# Patient Record
Sex: Male | Born: 1960 | Race: White | Hispanic: No | Marital: Married | Smoking: Former smoker
Health system: Southern US, Community
[De-identification: ages and names within clinical notes are randomized; demographics above are authoritative.]

## PROBLEM LIST (undated history)

## (undated) DIAGNOSIS — E785 Hyperlipidemia, unspecified: Secondary | ICD-10-CM

## (undated) DIAGNOSIS — K219 Gastro-esophageal reflux disease without esophagitis: Secondary | ICD-10-CM

## (undated) DIAGNOSIS — I10 Essential (primary) hypertension: Secondary | ICD-10-CM

## (undated) DIAGNOSIS — J40 Bronchitis, not specified as acute or chronic: Secondary | ICD-10-CM

## (undated) DIAGNOSIS — F1020 Alcohol dependence, uncomplicated: Secondary | ICD-10-CM

## (undated) DIAGNOSIS — J449 Chronic obstructive pulmonary disease, unspecified: Secondary | ICD-10-CM

## (undated) DIAGNOSIS — G4730 Sleep apnea, unspecified: Secondary | ICD-10-CM

## (undated) DIAGNOSIS — M1990 Unspecified osteoarthritis, unspecified site: Secondary | ICD-10-CM

## (undated) HISTORY — PX: COLONOSCOPY: SHX174

## (undated) HISTORY — PX: TOOTH EXTRACTION: SUR596

---

## 1998-05-26 HISTORY — PX: MULTIPLE TOOTH EXTRACTIONS: SHX2053

## 1999-04-03 ENCOUNTER — Encounter: Admission: RE | Admit: 1999-04-03 | Discharge: 1999-04-03 | Payer: Self-pay | Admitting: Family Medicine

## 1999-04-03 ENCOUNTER — Encounter: Payer: Self-pay | Admitting: Family Medicine

## 2001-07-08 ENCOUNTER — Encounter: Payer: Self-pay | Admitting: General Surgery

## 2001-07-08 ENCOUNTER — Encounter: Admission: RE | Admit: 2001-07-08 | Discharge: 2001-07-08 | Payer: Self-pay | Admitting: General Surgery

## 2001-09-15 ENCOUNTER — Encounter (INDEPENDENT_AMBULATORY_CARE_PROVIDER_SITE_OTHER): Payer: Self-pay | Admitting: Specialist

## 2001-09-15 ENCOUNTER — Ambulatory Visit (HOSPITAL_COMMUNITY): Admission: RE | Admit: 2001-09-15 | Discharge: 2001-09-15 | Payer: Self-pay | Admitting: Gastroenterology

## 2004-12-30 ENCOUNTER — Emergency Department (HOSPITAL_COMMUNITY): Admission: EM | Admit: 2004-12-30 | Discharge: 2004-12-30 | Payer: Self-pay | Admitting: Emergency Medicine

## 2006-11-19 ENCOUNTER — Ambulatory Visit (HOSPITAL_COMMUNITY): Admission: RE | Admit: 2006-11-19 | Discharge: 2006-11-19 | Payer: Self-pay | Admitting: Orthopedic Surgery

## 2008-05-26 HISTORY — PX: KNEE ARTHROPLASTY: SHX992

## 2010-10-11 NOTE — Procedures (Signed)
[facility]  Patient:    Charles Leblanc, Charles Leblanc Visit Number: [id number] MRN: [id number]          Service Type: END Location: ENDO Attending Physician:  Dennison Bulla Ii Dictated by:   Verlin Grills, M.D. Proc. Date: 09/15/01 Admit Date:  09/15/2001   CC:         Sharlet Salina T. Hoxworth, M.D.  Blair Heys, M.D.   Procedure Report  PROCEDURE:  Colonoscopy with rectal polypectomy.  REFERRING PHYSICIAN:  Blair Heys, M.D.  HISTORY:  Mr. Charles Leblanc is a 50 year old male, born [birth year]. When Mr. Charles Leblanc developed left lower quadrant abdominal pain, he was evaluated by Dr. Jaclynn Guarneri; a CT scan of the abdomen and pelvis revealed thickening of the sigmoid colon.  Mr. Charles Leblanc has completed a course of antibiotic therapy for acute diverticulitis.  He continues to have left lower quadrant abdominal discomfort but is having normal bowel movements unassociated with bleeding. He denies fever, anorexia, nausea, vomiting, or weight loss.  I discussed with Mr. Charles Leblanc the complications associated with colonoscopy and polypectomy, including intestinal bleeding and intestinal perforation. Mr. Charles Leblanc has signed the operative permit.  PAST MEDICAL HISTORY:  No chronic medical problems.  PAST SURGICAL HISTORY:  None.  HABITS:  Mr. Charles Leblanc smokes 2 packs of cigarettes per day and consumes beer on a daily basis.  FAMILY HISTORY:  Mr. Charles Leblanc father was diagnosed with colon cancer.  ENDOSCOPIST:  Verlin Grills, M.D.  PREMEDICATION:  Demerol 100 mg, Versed 10 mg.  ENDOSCOPE:  Olympus pediatric colonoscope.  DESCRIPTION OF PROCEDURE:  After obtaining informed consent, Mr. Charles Leblanc was placed in the left lateral decubitus position.  I administered intravenous Demerol and intravenous Versed to achieve conscious sedation for the procedure.  The patients blood pressure, oxygen saturation, and cardiac rhythm were monitored throughout the procedure  and documented in the medical record.  Anal inspection was normal.  Digital rectal exam revealed a nonnodular prostate.  The Olympus pediatric video colonoscope was introduced into the rectum and easily advanced to the cecum.  Colonic preparation for the exam today was excellent.  RECTUM:  Normal.  SIGMOID COLON AND DESCENDING COLON:  Colonic diverticulosis associated with muscular hypertrophy but no signs of acute diverticulitis, diverticular stricture formation, or inflammatory bowel disease.  SPLENIC FLEXURE:  Normal.  TRANSVERSE COLON:  Normal.  HEPATIC FLEXURE:  Normal.  ASCENDING COLON:  Normal.  CECUM AND ILEOCECAL VALVE:  Normal.  ASSESSMENT: 1. Resolved acute diverticulitis. 2. Sigmoid colonic diverticulosis associated with muscular hypertrophy. 3. No endoscopic evidence for the presence of colorectal neoplasia or    inflammatory bowel disease.  ADDENDUM:  Rectum:  I did remove a 0.5 mm hyperplastic-appearing polyp from the mid rectum with the cold biopsy forceps.  If the polyp returns neoplastic, Mr. Charles Leblanc should undergo a repeat colonoscopy in five years. Dictated by:   Verlin Grills, M.D. Attending Physician:  Dennison Bulla Ii DD:  09/15/01 TD:  09/15/01 Job: [phone] [id number]

## 2011-08-05 ENCOUNTER — Ambulatory Visit: Payer: Self-pay

## 2011-08-05 ENCOUNTER — Other Ambulatory Visit: Payer: Self-pay | Admitting: Occupational Medicine

## 2011-08-05 DIAGNOSIS — Z0000 Encounter for general adult medical examination without abnormal findings: Secondary | ICD-10-CM

## 2013-01-16 ENCOUNTER — Encounter (HOSPITAL_COMMUNITY): Payer: Self-pay | Admitting: *Deleted

## 2013-01-16 ENCOUNTER — Emergency Department (INDEPENDENT_AMBULATORY_CARE_PROVIDER_SITE_OTHER)
Admission: EM | Admit: 2013-01-16 | Discharge: 2013-01-16 | Disposition: A | Discharge status: Home / Self Care | Payer: No Typology Code available for payment source | Source: Home / Self Care | Attending: Emergency Medicine | Admitting: Emergency Medicine

## 2013-01-16 DIAGNOSIS — L02419 Cutaneous abscess of limb, unspecified: Secondary | ICD-10-CM

## 2013-01-16 DIAGNOSIS — L0390 Cellulitis, unspecified: Secondary | ICD-10-CM

## 2013-01-16 DIAGNOSIS — Z23 Encounter for immunization: Secondary | ICD-10-CM

## 2013-01-16 DIAGNOSIS — A4902 Methicillin resistant Staphylococcus aureus infection, unspecified site: Secondary | ICD-10-CM

## 2013-01-16 DIAGNOSIS — L0291 Cutaneous abscess, unspecified: Secondary | ICD-10-CM

## 2013-01-16 HISTORY — DX: Unspecified osteoarthritis, unspecified site: M19.90

## 2013-01-16 HISTORY — DX: Gastro-esophageal reflux disease without esophagitis: K21.9

## 2013-01-16 HISTORY — DX: Essential (primary) hypertension: I10

## 2013-01-16 HISTORY — DX: Hyperlipidemia, unspecified: E78.5

## 2013-01-16 MED ORDER — SULFAMETHOXAZOLE-TMP DS 800-160 MG PO TABS: 1.0000 | ORAL_TABLET | Freq: Two times a day (BID) | ORAL | Status: DC

## 2013-01-16 MED ORDER — TETANUS-DIPHTH-ACELL PERTUSSIS 5-2.5-18.5 LF-MCG/0.5 IM SUSP: 0.5000 mL | Freq: Once | INTRAMUSCULAR | Status: DC

## 2013-01-16 MED ORDER — MUPIROCIN 2 % EX OINT: TOPICAL_OINTMENT | Freq: Three times a day (TID) | CUTANEOUS | Status: DC

## 2013-01-16 MED ORDER — CEFTRIAXONE SODIUM 1 G IJ SOLR
INTRAMUSCULAR | Status: AC
  Filled 2013-01-16: qty 10

## 2013-01-16 MED ORDER — TETANUS-DIPHTH-ACELL PERTUSSIS 5-2.5-18.5 LF-MCG/0.5 IM SUSP
INTRAMUSCULAR | Status: AC
  Filled 2013-01-16: qty 0.5

## 2013-01-16 MED ORDER — CEFTRIAXONE SODIUM 1 G IJ SOLR
1.0000 g | Freq: Once | INTRAMUSCULAR | Status: AC
  Administered 2013-01-16: 1 g via INTRAMUSCULAR

## 2013-01-16 MED ORDER — TETANUS-DIPHTH-ACELL PERTUSSIS 5-2.5-18.5 LF-MCG/0.5 IM SUSP
0.5000 mL | Freq: Once | INTRAMUSCULAR | Status: AC
  Administered 2013-01-16: 0.5 mL via INTRAMUSCULAR

## 2013-01-16 MED ORDER — HYDROCODONE-ACETAMINOPHEN 5-325 MG PO TABS: ORAL_TABLET | ORAL | Status: DC

## 2013-01-16 MED ORDER — CEPHALEXIN 500 MG PO CAPS: 500.0000 mg | ORAL_CAPSULE | Freq: Three times a day (TID) | ORAL | Status: DC

## 2013-01-16 MED ORDER — LIDOCAINE HCL (PF) 1 % IJ SOLN
INTRAMUSCULAR | Status: AC
  Filled 2013-01-16: qty 5

## 2013-01-16 NOTE — ED Provider Notes (Signed)
Chief Complaint:   Chief Complaint  Patient presents with  . Wound Infection    History of Present Illness:   Charles Leblanc is a 52 year old male who banged his left, proximal lower leg on a trailer hitch of his truck this past Wednesday, 5 days ago. He sustained a small abrasion this area. Over the past 2 days it's become swollen, erythematous, tender to touch, and draining some pus. He denies any fever or chills.  Review of Systems:  Other than noted above, the patient denies any of the following symptoms: Systemic:  No fever, chills, sweats, weight loss, or fatigue. ENT:  No nasal congestion, rhinorrhea, sore throat, swelling of lips, tongue or throat. Resp:  No cough, wheezing, or shortness of breath. Skin:  No rash, itching, nodules, or suspicious lesions.  PMFSH:  Past medical history, family history, social history, meds, and allergies were reviewed. He has no allergies. He takes omeprazole, metoprolol, Mobic, and atorvastatin. He has a history of gastroesophageal reflux, hypercholesterolemia, hypertension, and arthritis.  Physical Exam:   Vital signs:  BP 140/83  Pulse 66  Temp(Src) 98.1 F (36.7 C) (Oral)  Resp 14  SpO2 97% Gen:  Alert, oriented, in no distress. ENT:  Pharynx clear, no intraoral lesions, moist mucous membranes. Lungs:  Clear to auscultation. Skin:  There is a 1.5 cm abrasion on the left, proximal, lower pretibial surface, just below the knee. This had a small amount of yellowish exudate. There is a 7 cm area of surrounding erythema and tenderness to palpation.   Course in Urgent Care Center:   He was given Rocephin 1 g IM a Tdap vaccine.  Assessment:  The encounter diagnosis was Cellulitis.  Abrasion with secondary infection. The wound was cultured and he was started on cephalexin and Septra, pending results of culture. Return again in 48 hours for recheck. He should stay off of his feet and elevate the leg in the meantime.  Plan:   1.  The following meds  were prescribed:   Discharge Medication List as of 01/16/2013 10:27 AM    START taking these medications   Details  cephALEXin (KEFLEX) 500 MG capsule Take 1 capsule (500 mg total) by mouth 3 (three) times daily., Starting 01/16/2013, Until Discontinued, Normal    HYDROcodone-acetaminophen (NORCO/VICODIN) 5-325 MG per tablet 1 to 2 tabs every 4 to 6 hours as needed for pain., Print    mupirocin ointment (BACTROBAN) 2 % Apply topically 3 (three) times daily., Starting 01/16/2013, Until Discontinued, Normal    sulfamethoxazole-trimethoprim (BACTRIM DS) 800-160 MG per tablet Take 1 tablet by mouth 2 (two) times daily., Starting 01/16/2013, Until Discontinued, Normal       2.  The patient was instructed in symptomatic care and handouts were given. 3.  The patient was told to return if becoming worse in any way, here in 48 hours for a scheduled recheck, and given some red flag symptoms such as fever or progression of the erythema that would indicate earlier return. 4.  Follow up here in 48 hours for a scheduled recheck.     Reuben Likes, MD 01/16/13 2103

## 2013-01-16 NOTE — ED Notes (Signed)
Report bumping left proximal lower leg on Thursday; has weeping circular wound to area with erythema and swelling in entire lower leg.  Denies any fevers.  Has been cleansing with soap & water, soaking in Epsom salts, and applying Neosporin.

## 2013-01-18 ENCOUNTER — Encounter (HOSPITAL_COMMUNITY): Payer: Self-pay | Admitting: Emergency Medicine

## 2013-01-18 ENCOUNTER — Emergency Department (INDEPENDENT_AMBULATORY_CARE_PROVIDER_SITE_OTHER)
Admission: EM | Admit: 2013-01-18 | Discharge: 2013-01-18 | Disposition: A | Discharge status: Home / Self Care | Payer: No Typology Code available for payment source | Source: Home / Self Care | Attending: Family Medicine | Admitting: Family Medicine

## 2013-01-18 DIAGNOSIS — L02419 Cutaneous abscess of limb, unspecified: Secondary | ICD-10-CM

## 2013-01-18 LAB — WOUND CULTURE

## 2013-01-18 NOTE — ED Notes (Signed)
Abscess culture below R knee: Abundant MRSA.  Pt. adequately treated with Bactrim DS and also got Keflex. I called pt. Pt. verified x 2 and given results.  Pt. told he was adequately treated with Bactrim DS. I told him I to keep taking all the antibiotics until finished. I told him I  would ask Dr. Lorenz Coaster about the Keflex and call back if he is to stop it.  I reviewed the [facility] MRSA instructions. He voiced understanding. Vassie Moselle 01/18/2013

## 2013-01-18 NOTE — ED Notes (Addendum)
C/o right leg swelling due to a sore follow up.  Patient states that Dr. Lorenz Coaster requested him to come back today.

## 2013-01-18 NOTE — ED Provider Notes (Signed)
CSN: [id number]     Arrival date & time 01/18/13  0802 History   First MD Initiated Contact with Patient 01/18/13 [phone]     Chief Complaint  Patient presents with  . Leg Swelling   (Consider location/radiation/quality/duration/timing/severity/associated sxs/prior Treatment) Patient is a 52 y.o. male presenting with wound check. The history is provided by the patient.  Wound Check This is a new problem. The current episode started 2 days ago (seen 8/24 for initial care, here for recheck, still pain and sts.). The problem has been gradually worsening.    Past Medical History  Diagnosis Date  . GERD (gastroesophageal reflux disease)   . Arthritis   . Hypertension   . Hyperlipidemia    History reviewed. No pertinent past surgical history. History reviewed. No pertinent family history. History  Substance Use Topics  . Smoking status: Not on file  . Smokeless tobacco: Not on file  . Alcohol Use: No    Review of Systems  Constitutional: Negative.   Skin: Positive for wound.    Allergies  Review of patient's allergies indicates no known allergies.  Home Medications   Current Outpatient Rx  Name  Route  Sig  Dispense  Refill  . atorvastatin (LIPITOR) 10 MG tablet   Oral   Take 20 mg by mouth daily.         . cephALEXin (KEFLEX) 500 MG capsule   Oral   Take 1 capsule (500 mg total) by mouth 3 (three) times daily.   30 capsule   0   . HYDROcodone-acetaminophen (NORCO/VICODIN) 5-325 MG per tablet      1 to 2 tabs every 4 to 6 hours as needed for pain.   20 tablet   0   . meloxicam (MOBIC) 7.5 MG tablet   Oral   Take 7.5 mg by mouth daily.         . metoprolol tartrate (LOPRESSOR) 25 MG tablet   Oral   Take 25 mg by mouth daily.         . mupirocin ointment (BACTROBAN) 2 %   Topical   Apply topically 3 (three) times daily.   22 g   0   . omeprazole (PRILOSEC) 20 MG capsule   Oral   Take 20 mg by mouth daily.         Marland Kitchen sulfamethoxazole-trimethoprim  (BACTRIM DS) 800-160 MG per tablet   Oral   Take 1 tablet by mouth 2 (two) times daily.   20 tablet   0    BP 143/78  Pulse 64  Temp(Src) 97.9 F (36.6 C) (Oral)  Resp 16  SpO2 100% Physical Exam  Nursing note and vitals reviewed. Constitutional: He is oriented to person, place, and time. He appears well-developed and well-nourished.  Neurological: He is alert and oriented to person, place, and time.  Skin: Skin is warm and dry.  1.5cm wound with copious purulent drainage expressed, mod erythema and tenderness.    ED Course  Debridement Date/Time: 01/18/2013 8:49 AM Performed by: Linna Hoff Authorized by: Bradd Canary D Consent: Verbal consent obtained. Risks and benefits: risks, benefits and alternatives were discussed Consent given by: patient Preparation: Patient was prepped and draped in the usual sterile fashion. Local anesthesia used: no Patient sedated: no Patient tolerance: Patient tolerated the procedure well with no immediate complications. Comments: ecshar debrided, drainage expressed, scrubbed , xeroform dsd.   (including critical care time) Labs Review Labs Reviewed - No data to display Imaging Review No results  found.  MDM   1. Cellulitis and abscess of leg       Linna Hoff, MD 01/18/13 [phone]

## 2013-01-20 ENCOUNTER — Encounter (HOSPITAL_COMMUNITY): Payer: Self-pay | Admitting: Family Medicine

## 2013-01-20 ENCOUNTER — Emergency Department (INDEPENDENT_AMBULATORY_CARE_PROVIDER_SITE_OTHER)
Admission: EM | Admit: 2013-01-20 | Discharge: 2013-01-20 | Disposition: A | Discharge status: Home / Self Care | Payer: No Typology Code available for payment source | Source: Home / Self Care

## 2013-01-20 DIAGNOSIS — A4902 Methicillin resistant Staphylococcus aureus infection, unspecified site: Secondary | ICD-10-CM

## 2013-01-20 NOTE — ED Provider Notes (Signed)
CSN: [id number]     Arrival date & time 01/20/13  1555 History   None    No chief complaint on file.  (Consider location/radiation/quality/duration/timing/severity/associated sxs/prior Treatment) HPI  Wound check: Dx w/ cellulitis and given rocephin in UC and sent home w/ Keflex and bactrim. Swelling markedly improved. Leg still aches. Still w/ pussy discharge. Occasional bleeding. Denies, fevers, vomiting, diarrhea. Occasional nausea. Using bactrim ointment  HTN: in pain and upset at the people up front. No CP.   Past Medical History  Diagnosis Date  . GERD (gastroesophageal reflux disease)   . Arthritis   . Hypertension   . Hyperlipidemia    No past surgical history on file. Family History  Problem Relation Age of Onset  . Heart failure Father   . Cancer Father     colon   History  Substance Use Topics  . Smoking status: Not on file  . Smokeless tobacco: Not on file  . Alcohol Use: No    Review of Systems  Constitutional: Negative for fever and chills.  Skin: Positive for wound.  All other systems reviewed and are negative.    Allergies  Review of patient's allergies indicates no known allergies.  Home Medications   Current Outpatient Rx  Name  Route  Sig  Dispense  Refill  . atorvastatin (LIPITOR) 10 MG tablet   Oral   Take 20 mg by mouth daily.         . cephALEXin (KEFLEX) 500 MG capsule   Oral   Take 1 capsule (500 mg total) by mouth 3 (three) times daily.   30 capsule   0   . HYDROcodone-acetaminophen (NORCO/VICODIN) 5-325 MG per tablet      1 to 2 tabs every 4 to 6 hours as needed for pain.   20 tablet   0   . meloxicam (MOBIC) 7.5 MG tablet   Oral   Take 7.5 mg by mouth daily.         . metoprolol tartrate (LOPRESSOR) 25 MG tablet   Oral   Take 25 mg by mouth daily.         . mupirocin ointment (BACTROBAN) 2 %   Topical   Apply topically 3 (three) times daily.   22 g   0   . omeprazole (PRILOSEC) 20 MG capsule   Oral   Take  20 mg by mouth daily.         Marland Kitchen sulfamethoxazole-trimethoprim (BACTRIM DS) 800-160 MG per tablet   Oral   Take 1 tablet by mouth 2 (two) times daily.   20 tablet   0    BP 157/90  Pulse 65  Temp(Src) 97.2 F (36.2 C) (Oral)  Resp 16  SpO2 98% Physical Exam  Constitutional: He is oriented to person, place, and time. He appears well-developed and well-nourished. No distress.  HENT:  Head: Normocephalic and atraumatic.  Eyes: EOM are normal. Pupils are equal, round, and reactive to light.  Neck: Normal range of motion. Neck supple.  Cardiovascular: Normal rate.   Pulmonary/Chest: Effort normal. No respiratory distress.  Abdominal: Soft. He exhibits no distension.  Musculoskeletal: Normal range of motion. He exhibits no edema.  Neurological: He is alert and oriented to person, place, and time.  Skin:  R tibial lesion about 1.5cm in diameter w/ central eschar and minimal surounding erythema and no induration.   Psychiatric: He has a normal mood and affect. His behavior is normal. Thought content normal.    ED Course  Procedures (including critical care time) Labs Review Labs Reviewed - No data to display Imaging Review No results found.  MDM   1. MRSA infection   52yo M w/ MRSA skin lesion. Well healing. Silvadene cream and clean gauze applied in office.  - continue mupirocin oint and Bactrim PO - leave open to air starting tomorrow.  - pt to rest and is off work for nex 5 days. - precautions given and all questions answered - f/u PRN - HTN, keep current regimen. Elevation from today likely from anxiety and upset over scheduling  Shelly Flatten, MD Family Medicine PGY-3 01/20/2013, 5:39 PM       Ozella Rocks, MD 01/20/13 1739

## 2013-01-20 NOTE — ED Notes (Signed)
See physicians assessment.  Still draining.

## 2013-01-20 NOTE — ED Provider Notes (Signed)
Medical screening examination/treatment/procedure(s) were performed by resident physician or non-physician practitioner and as supervising physician I was immediately available for consultation/collaboration.   Barkley Bruns MD.   Linna Hoff, MD 01/20/13 2056

## 2013-01-23 ENCOUNTER — Telehealth (HOSPITAL_COMMUNITY): Payer: Self-pay | Admitting: *Deleted

## 2013-01-23 NOTE — ED Notes (Signed)
Dr. Lorenz Coaster wrote, pt. can finish the Bactrim and stop the Keflex. I called pt. and and gave him this information. Charles Leblanc 01/23/2013

## 2015-07-26 ENCOUNTER — Ambulatory Visit (INDEPENDENT_AMBULATORY_CARE_PROVIDER_SITE_OTHER): Payer: No Typology Code available for payment source | Admitting: Pulmonary Disease

## 2015-07-26 ENCOUNTER — Encounter: Payer: Self-pay | Admitting: Pulmonary Disease

## 2015-07-26 VITALS — BP 122/90 | HR 66 | Ht 70.0 in | Wt 200.0 lb

## 2015-07-26 DIAGNOSIS — R0609 Other forms of dyspnea: Secondary | ICD-10-CM | POA: Insufficient documentation

## 2015-07-26 DIAGNOSIS — J449 Chronic obstructive pulmonary disease, unspecified: Secondary | ICD-10-CM | POA: Insufficient documentation

## 2015-07-26 DIAGNOSIS — J438 Other emphysema: Secondary | ICD-10-CM

## 2015-07-26 DIAGNOSIS — G4733 Obstructive sleep apnea (adult) (pediatric): Secondary | ICD-10-CM

## 2015-07-26 NOTE — Assessment & Plan Note (Addendum)
Patient 30 PY smoking history, quit in 1999.  Patient has not been diagnosed with asthma or COPD. No recent infection. Denies any allergies.  Patient had a bronchitis in December 2016. She had cough, congestion, wheezing, dyspnea.Marland Kitchen He was given ALBUTEROL inhaler. Was also given antibiotics for 10 days. He uses albuterol 2 puffs, 6-8 times a day. He is not significantly improved. Gets dyspneic and wheezing with exertion. Does not been admitted.   Chest CT scan with contrast (07/04/2015)-mild biapical paraseptal emphysema. No lymphadenopathy seen. No infiltrates seen. CT scan personally reviewed.  Plan: Needs PFT. May need abg.  Start symbicort. 160/4.5 2p bid May need alpha one. 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 Need to check PFTs.  Patient advised to call if not better.

## 2015-07-26 NOTE — Assessment & Plan Note (Signed)
Patient would recent exertional dyspnea. Significant smoking history. Chest CT scan in February with mild COPD changes. Plan to treat COPD. Might need cardiac workup.

## 2015-07-26 NOTE — Progress Notes (Signed)
Subjective:    Patient ID: Charles Leblanc, male    DOB: 01/25/1961, 55 y.o.   MRN: [id number]  HPI  This is the case of Charles Leblanc, 55 y.o. Male, who is a self-referral for dyspnea, wheezing..   As you very well know, patient 30 PY smoking history, quit in 1999.  Patient has not been diagnosed with asthma or COPD. No recent infection. Denies any allergies.  Patient had a bronchitis in December 2016. She had cough, congestion, wheezing, dyspnea.Marland Kitchen He was given ALBUTEROL inhaler. Was also given antibiotics for 10 days. He uses albuterol 2 puffs, 6-8 times a day. He is not significantly improved. Gets dyspneic and wheezing with exertion. Does not been admitted.   Patient was diagnosed with sleep apnea in 2015. He is on CPAP 10 cm water. He feels better using CPAP. No problems with it. He gets supplies from [facility].   Review of Systems  Constitutional: Positive for appetite change. Negative for fever, chills, activity change and unexpected weight change.  HENT: Negative for congestion, dental problem, postnasal drip, rhinorrhea, sneezing, sore throat, trouble swallowing and voice change.   Eyes: Negative for visual disturbance.  Respiratory: Positive for cough, shortness of breath and wheezing. Negative for choking.   Cardiovascular: Negative for chest pain, palpitations and leg swelling.  Gastrointestinal: Negative for nausea, vomiting and abdominal pain.  Genitourinary: Negative for difficulty urinating.  Musculoskeletal: Positive for arthralgias.  Skin: Negative for rash.  Psychiatric/Behavioral: Negative for behavioral problems and confusion.       Past Medical History  Diagnosis Date  . GERD (gastroesophageal reflux disease)   . Arthritis   . Hypertension   . Hyperlipidemia    denies CA, DVT,CVA  Family History  Problem Relation Age of Onset  . Heart failure Father   . Colon cancer Father   . Breast cancer Sister      No past surgical history on  file. (-) surgery  Social History   Social History  . Marital Status: Married    Spouse Name: N/A  . Number of Children: N/A  . Years of Education: N/A   Occupational History  . [employer]- makes plastic cups    Social History Main Topics  . Smoking status: Former Smoker -- 2.00 packs/day for 20 years    Types: Cigarettes    Quit date: 05/26/2014  . Smokeless tobacco: Never Used  . Alcohol Use: No  . Drug Use: No  . Sexual Activity: Not on file   Other Topics Concern  . Not on file   Social History Narrative   Married with 2 children. Works as a Estate manager/land agent. Abstinent of smoking and drinking.  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  Allergies  Allergen Reactions  . Lipitor [Atorvastatin]     Migraines Myalgia     Outpatient Prescriptions Prior to Visit  Medication Sig Dispense Refill  . meloxicam (MOBIC) 7.5 MG tablet Take 7.5 mg by mouth 2 (two) times daily.     . metoprolol tartrate (LOPRESSOR) 25 MG tablet Take 25 mg by mouth 2 (two) times daily.     Marland Kitchen omeprazole (PRILOSEC) 20 MG capsule Take 20 mg by mouth daily.    Marland Kitchen atorvastatin (LIPITOR) 10 MG tablet Take 20 mg by mouth daily.    . cephALEXin (KEFLEX) 500 MG capsule Take 1 capsule (500 mg total) by mouth 3 (three) times daily. 30 capsule 0  . HYDROcodone-acetaminophen (NORCO/VICODIN) 5-325 MG per tablet  1 to 2 tabs every 4 to 6 hours as needed for pain. 20 tablet 0  . mupirocin ointment (BACTROBAN) 2 % Apply topically 3 (three) times daily. 22 g 0  . sulfamethoxazole-trimethoprim (BACTRIM DS) 800-160 MG per tablet Take 1 tablet by mouth 2 (two) times daily. 20 tablet 0   No facility-administered medications prior to visit.   Meds ordered this encounter  Medications  . PROAIR HFA 108 (90 Base) MCG/ACT inhaler    Sig: Inhale 2 puffs into the lungs every 4 (four) hours as needed.    Refill:  2  . amitriptyline (ELAVIL) 25 MG tablet    Sig: Take 25 mg by mouth at bedtime.  .  traMADol (ULTRAM) 50 MG tablet    Sig: Take 50 mg by mouth every 6 (six) hours as needed.  . venlafaxine (EFFEXOR) 37.5 MG tablet    Sig: Take 37.5 mg by mouth daily.       Objective:   Physical Exam  Vitals:  Filed Vitals:   07/26/15 1447  BP: 122/90  Pulse: 66  Height: 5\' 10"  (1.778 m)  Weight: 200 lb (90.719 kg)  SpO2: 99%    Constitutional/General:  Pleasant, well-nourished, well-developed, not in any distress,  Comfortably seating.  Well kempt  Body mass index is 28.7 kg/(m^2). Wt Readings from Last 3 Encounters:  07/26/15 200 lb (90.719 kg)     HEENT: Pupils equal and reactive to light and accommodation. Anicteric sclerae. Normal nasal mucosa.   No oral  lesions,  mouth clear,  oropharynx clear, no postnasal drip. (-) Oral thrush. No dental caries.  Airway - Mallampati class III  Neck: No masses. Midline trachea. No JVD, (-) LAD. (-) bruits appreciated.  Respiratory/Chest: Grossly normal chest. (-) deformity. (-) Accessory muscle use.  Symmetric expansion. (-) Tenderness on palpation.  Resonant on percussion.  Diminished BS on both lower lung zones. (-) wheezing, crackles, rhonchi (-) egophony  Cardiovascular: Regular rate and  rhythm, heart sounds normal, no murmur or gallops, no peripheral edema  Gastrointestinal:  Normal bowel sounds. Soft, non-tender. No hepatosplenomegaly.  (-) masses.   Musculoskeletal:  Normal muscle tone. Normal gait.   Extremities: Grossly normal. (-) clubbing, cyanosis.  (-) edema  Skin: (-) rash,lesions seen.   Neurological/Psychiatric : alert, oriented to time, place, person. Normal mood and affect            Assessment & Plan:  COPD with emphysema (HCC) Patient 30 PY smoking history, quit in 1999.  Patient has not been diagnosed with asthma or COPD. No recent infection. Denies any allergies.  Patient had a bronchitis in December 2016. She had cough, congestion, wheezing, dyspnea.Marland Kitchen He was given ALBUTEROL  inhaler. Was also given antibiotics for 10 days. He uses albuterol 2 puffs, 6-8 times a day. He is not significantly improved. Gets dyspneic and wheezing with exertion. Does not been admitted.   Chest CT scan with contrast (07/04/2015)-mild biapical paraseptal emphysema. No lymphadenopathy seen. No infiltrates seen. CT scan personally reviewed.  Plan: Needs PFT. May need abg.  Start symbicort. 160/4.5 2p bid May need alpha one. 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 Need to check PFTs.  Patient advised to call if not better.  OSA (obstructive sleep apnea) Continue CPAP. Per his recollection, is at 10 cm water. DME is [facility]. Plan to get a download for the last 3 months. No issues with CPAP. Feels better using it.  Exertional dyspnea  Patient would recent exertional dyspnea. Significant smoking history. Chest CT scan in February with mild COPD changes. Plan to treat COPD. Might need cardiac workup.   Return to clinic in 6 weeks. Patient to call if not better.  Pollie Meyer, MD [facility] Office: [phone], Fax: [phone]

## 2015-07-26 NOTE — Assessment & Plan Note (Signed)
Continue CPAP. Per his recollection, is at 10 cm water. DME is [facility]. Plan to get a download for the last 3 months. No issues with CPAP. Feels better using it.

## 2015-07-26 NOTE — Patient Instructions (Addendum)
1. Start symbicort 160/4.5 2puffs 2x/day. 2. We will schedule you for a breathing test.  3. We will get a download of your cpap machine. Cont using your cpap.  Return to clinic in 6 weeks.   Pollie Meyer, MD [facility] Office: [phone], Fax: [phone]

## 2015-07-31 ENCOUNTER — Telehealth: Payer: Self-pay | Admitting: Pulmonary Disease

## 2015-07-31 DIAGNOSIS — R0609 Other forms of dyspnea: Principal | ICD-10-CM

## 2015-07-31 NOTE — Telephone Encounter (Signed)
Spoke with pt's wife, wants to know if Dr. Christene Slatese Dios is ok with referring pt to a cardiologist- Symbicort is helping with pt's breathing but his chest pain is unchanged.  Per wife, chest pain has been present X6-8 mos.    Dr. Christene Slatese Dios please advise if you're ok with this referral.  Thanks!

## 2015-07-31 NOTE — Telephone Encounter (Signed)
pls refer pt to cards per his request. Dx : CP and SOB. thanks

## 2015-08-01 NOTE — Telephone Encounter (Signed)
Referral placed.

## 2015-08-29 ENCOUNTER — Encounter: Payer: Self-pay | Admitting: Cardiovascular Disease

## 2015-08-29 ENCOUNTER — Ambulatory Visit (INDEPENDENT_AMBULATORY_CARE_PROVIDER_SITE_OTHER): Payer: No Typology Code available for payment source | Admitting: Cardiovascular Disease

## 2015-08-29 VITALS — BP 134/100 | HR 60 | Ht 70.0 in | Wt 199.0 lb

## 2015-08-29 DIAGNOSIS — R079 Chest pain, unspecified: Secondary | ICD-10-CM

## 2015-08-29 DIAGNOSIS — I10 Essential (primary) hypertension: Secondary | ICD-10-CM | POA: Diagnosis not present

## 2015-08-29 DIAGNOSIS — R5383 Other fatigue: Secondary | ICD-10-CM

## 2015-08-29 DIAGNOSIS — R0600 Dyspnea, unspecified: Secondary | ICD-10-CM | POA: Diagnosis not present

## 2015-08-29 DIAGNOSIS — Z13220 Encounter for screening for lipoid disorders: Secondary | ICD-10-CM

## 2015-08-29 LAB — COMPREHENSIVE METABOLIC PANEL
ALBUMIN: 4.8 g/dL (ref 3.6–5.1)
ALT: 13 U/L (ref 9–46)
AST: 15 U/L (ref 10–35)
Alkaline Phosphatase: 83 U/L (ref 40–115)
BILIRUBIN TOTAL: 0.4 mg/dL (ref 0.2–1.2)
BUN: 12 mg/dL (ref 7–25)
CHLORIDE: 100 mmol/L (ref 98–110)
CO2: 29 mmol/L (ref 20–31)
CREATININE: 1.23 mg/dL (ref 0.70–1.33)
Calcium: 10.1 mg/dL (ref 8.6–10.3)
GLUCOSE: 87 mg/dL (ref 65–99)
Potassium: 4.5 mmol/L (ref 3.5–5.3)
SODIUM: 138 mmol/L (ref 135–146)
Total Protein: 7.5 g/dL (ref 6.1–8.1)

## 2015-08-29 LAB — CBC WITH DIFFERENTIAL/PLATELET
BASOS ABS: 79 {cells}/uL (ref 0–200)
Basophils Relative: 1 %
EOS ABS: 395 {cells}/uL (ref 15–500)
Eosinophils Relative: 5 %
HEMATOCRIT: 45.6 % (ref 38.5–50.0)
Hemoglobin: 15.7 g/dL (ref 13.2–17.1)
Lymphocytes Relative: 36 %
Lymphs Abs: 2844 cells/uL (ref 850–3900)
MCH: 29.9 pg (ref 27.0–33.0)
MCHC: 34.4 g/dL (ref 32.0–36.0)
MCV: 86.9 fL (ref 80.0–100.0)
MONO ABS: 790 {cells}/uL (ref 200–950)
MONOS PCT: 10 %
MPV: 10.6 fL (ref 7.5–12.5)
NEUTROS ABS: 3792 {cells}/uL (ref 1500–7800)
Neutrophils Relative %: 48 %
PLATELETS: 295 10*3/uL (ref 140–400)
RBC: 5.25 MIL/uL (ref 4.20–5.80)
RDW: 12.8 % (ref 11.0–15.0)
WBC: 7.9 10*3/uL (ref 3.8–10.8)

## 2015-08-29 LAB — LIPID PANEL
CHOL/HDL RATIO: 4.8 ratio (ref <=5.0)
Cholesterol: 257 mg/dL — ABNORMAL HIGH (ref 125–200)
HDL: 53 mg/dL (ref >=40)
LDL CALC: 186 mg/dL — AB (ref <130)
Triglycerides: 92 mg/dL (ref <150)
VLDL: 18 mg/dL (ref <30)

## 2015-08-29 LAB — TSH: TSH: 1.13 mIU/L (ref 0.40–4.50)

## 2015-08-29 MED ORDER — CARVEDILOL 12.5 MG PO TABS: 12.5000 mg | ORAL_TABLET | Freq: Two times a day (BID) | ORAL | Status: DC

## 2015-08-29 MED ORDER — POTASSIUM CHLORIDE CRYS ER 20 MEQ PO TBCR: 20.0000 meq | EXTENDED_RELEASE_TABLET | Freq: Every day | ORAL | Status: DC

## 2015-08-29 MED ORDER — HYDROCHLOROTHIAZIDE 25 MG PO TABS: 25.0000 mg | ORAL_TABLET | Freq: Every day | ORAL | Status: DC

## 2015-08-29 NOTE — Progress Notes (Signed)
Cardiology Office Note   Date:  08/29/2015   ID:  Charles Leblanc, DOB Oct 06, 1960, MRN [id number]  PCP:  Eloisa Northern, MD  Cardiologist:   Vesta Mixer, MD   Chief Complaint  Patient presents with  . Chest Pain    KNEE SWELLING BUT PTHAS A BAD KNEE AND NEEDS A KNEE OPERATION  . Shortness of Breath   Problem list 1. Essential HTN 2. Chest pain  3. Hyperlipidemia 4. Obstructive sleep apnea   History of Present Illness: Charles Leblanc is a 55 y.o. male who presents for chest pain  Was seen with his wife , Charles Leblanc.  6-8 months of CP,  Mid sternal  typically at rest  Worse when something triggers his COPD  Works as a Games developer at [employer]. Exposed to various  Chemicals , smoke  Does not exercise,   Active during the weekends.  [place]   BP is a bit high today - its typically a little high , diastolic is typically 90-100, systolic is 140-160 Does not make an effort to avoid salt  - does not add any salt     Past Medical History  Diagnosis Date  . GERD (gastroesophageal reflux disease)   . Arthritis   . Hypertension   . Hyperlipidemia     No past surgical history on file.   Current Outpatient Prescriptions  Medication Sig Dispense Refill  . amitriptyline (ELAVIL) 25 MG tablet Take 25 mg by mouth at bedtime.    . budesonide-formoterol (SYMBICORT) 160-4.5 MCG/ACT inhaler Inhale 1 puff into the lungs 2 (two) times daily.    . meloxicam (MOBIC) 7.5 MG tablet Take 7.5 mg by mouth 2 (two) times daily.     . metoprolol tartrate (LOPRESSOR) 25 MG tablet Take 25 mg by mouth 2 (two) times daily.     Marland Kitchen omeprazole (PRILOSEC) 20 MG capsule Take 20 mg by mouth daily.    . OXYGEN Inhale into the lungs at bedtime. CPAP MASK AND MACHINE AT BEDTIME FOR SLEEP APNEA    . PROAIR HFA 108 (90 Base) MCG/ACT inhaler Inhale 2 puffs into the lungs every 4 (four) hours as needed for wheezing or shortness of breath.   2  . traMADol (ULTRAM) 50  MG tablet Take 50 mg by mouth every 6 (six) hours as needed for moderate pain.     Marland Kitchen venlafaxine (EFFEXOR) 37.5 MG tablet Take 37.5 mg by mouth daily.     No current facility-administered medications for this visit.    Allergies:   Lipitor    Social History:  The patient  reports that he quit smoking about 15 months ago. His smoking use included Cigarettes. He has a 40 pack-year smoking history. He has never used smokeless tobacco. He reports that he does not drink alcohol or use illicit drugs.   Family History:  The patient's family history includes Breast cancer in his sister; Colon cancer in his father; Heart failure in his father.    ROS:  Please see the history of present illness.    Review of Systems: Constitutional:  admits to   fatigue.  HEENT: denies photophobia, eye pain, redness, hearing loss, ear pain, congestion, sore throat, rhinorrhea, sneezing, neck pain, neck stiffness and tinnitus.  Respiratory: denies SOB, DOE, cough, chest tightness, and wheezing.  Cardiovascular: denies chest pain, palpitations and leg swelling.  Gastrointestinal: denies nausea, vomiting, abdominal pain, diarrhea, constipation, blood in stool.  Genitourinary: denies  dysuria, urgency, frequency, hematuria, flank pain and difficulty urinating.  Musculoskeletal: denies  myalgias, back pain, joint swelling, arthralgias and gait problem.   Skin: denies pallor, rash and wound.  Neurological: denies dizziness, seizures, syncope, weakness, light-headedness, numbness and headaches.   Hematological: denies adenopathy, easy bruising, personal or family bleeding history.  Psychiatric/ Behavioral: denies suicidal ideation, mood changes, confusion, nervousness, sleep disturbance and agitation.       All other systems are reviewed and negative.    PHYSICAL EXAM: VS:  BP 134/100 mmHg  Pulse 60  Ht 5\' 10"  (1.778 m)  Wt 199 lb (90.266 kg)  BMI 28.55 kg/m2 , BMI Body mass index is 28.55 kg/(m^2). GEN: Well  nourished, well developed, in no acute distress HEENT: normal Neck: no JVD, carotid bruits, or masses Cardiac: RRR; no murmurs, rubs, or gallops,no edema  Respiratory:  clear to auscultation bilaterally, normal work of breathing GI: soft, nontender, nondistended, + BS MS: no deformity or atrophy Skin: warm and dry, no rash Neuro:  Strength and sensation are intact Psych: normal   EKG:  EKG is ordered today. The ekg ordered today demonstrates  NSR  At 60.     Recent Labs: No results found for requested labs within last 365 days.    Lipid Panel No results found for: CHOL, TRIG, HDL, CHOLHDL, VLDL, LDLCALC, LDLDIRECT    Wt Readings from Last 3 Encounters:  08/29/15 199 lb (90.266 kg)  07/26/15 200 lb (90.719 kg)      Other studies Reviewed: Additional studies/ records that were reviewed today include: . Review of the above records demonstrates:    ASSESSMENT AND PLAN:  1.  Chest pain :    Some typical and atypical properties.  Mid sternal , tightness,  Associated with worsening dyspnea.    Occurs at rest and exercise Has several risk factors for CAD - former smoker, HTN, hyperlipidemia. Also has worsening fatigue  Will get a [employer] myoview ( has a bad right knee, may not be able to complete a GXT )   2. Dyspnea: He has significant dyspnea. This may be due to a COPD. I would like to get an echocardiogram for further evaluation of his heart. He's had long-standing hypertension and may have developed  heart failure.  3. Fatigue: He's not had any recent lab work. We'll check basic labs today including basic medical profile, liver enzymes, lipid profile, TSH, and CBC.  4. COPD: Continue with inhalers. He is followed by his general medical doctor.   Current medicines are reviewed at length with the patient today.  The patient does not have concerns regarding medicines.  The following changes have been made:  no change  Labs/ tests ordered today include:  No orders of  the defined types were placed in this encounter.     Disposition:   FU with me in 3 months    Oris Staffieri, Deloris PingPhilip J, MD  08/29/2015 9:00 AM    [facility] [address], [place], [place]  [address] Phone: [phone]; Fax: [phone]   [facility]  [address] [place], [place]  [address] [phone]   Fax [phone]

## 2015-08-29 NOTE — Patient Instructions (Addendum)
Medication Instructions:  STOP Metoprolol  START Carvedilol (Coreg) 12.5 mg twice daily START HCTZ (Hydrochlorothiazide) 25 mg once daily START Kdur (potassium supplement) 20 meq once daily   Labwork: TODAY - cholesterol, complete metabolic panel, TSH, CBC   Testing/Procedures: Your physician has requested that you have a lexiscan myoview. For further information please visit [email or web address]. Please follow instruction sheet, as given.  Your physician has requested that you have an echocardiogram. Echocardiography is a painless test that uses sound waves to create images of your heart. It provides your doctor with information about the size and shape of your heart and how well your heart's chambers and valves are working. This procedure takes approximately one hour. There are no restrictions for this procedure.   Follow-Up: Your physician recommends that you schedule a follow-up appointment in: 3 months with Dr. Elease HashimotoNahser.    If you need a refill on your cardiac medications before your next appointment, please call your pharmacy.   Thank you for choosing [facility]! Eligha BridegroomMichelle Swinyer, RN [phone]

## 2015-08-30 ENCOUNTER — Telehealth: Payer: Self-pay | Admitting: Nurse Practitioner

## 2015-08-30 DIAGNOSIS — E785 Hyperlipidemia, unspecified: Secondary | ICD-10-CM

## 2015-08-30 MED ORDER — EZETIMIBE 10 MG PO TABS: 10.0000 mg | ORAL_TABLET | Freq: Every day | ORAL | Status: DC

## 2015-08-30 NOTE — Telephone Encounter (Signed)
Reviewed lab results with patient.  He states he cannot take Lipitor - states he tried it in the past and it caused him to hurt all over and have terrible headaches.  We discussed other alternatives and he would like to try Zetia 10 mg. I scheduled him for repeat fasting labs July 11 and advised him to call back if he has any problems prior to that time.  He verbalized understanding and agreement.

## 2015-08-30 NOTE — Telephone Encounter (Signed)
Agree with zetia 10 mg a day Check labs in 3 months

## 2015-08-30 NOTE — Telephone Encounter (Signed)
-----   Message from Vesta MixerPhilip J Nahser, MD sent at 08/29/2015  5:33 PM EDT ----- TSH is normal Total chol and LDL are very elevated CMET is normal  Please start Atorvastatin 40 mg a day  Check lipids , cmet  In 3 months

## 2015-08-31 NOTE — Telephone Encounter (Signed)
Talked to patient's wife (DPR) she is aware of changes.

## 2015-09-11 ENCOUNTER — Telehealth (HOSPITAL_COMMUNITY): Payer: Self-pay | Admitting: *Deleted

## 2015-09-11 NOTE — Telephone Encounter (Signed)
Left message on voicemail in reference to upcoming appointment scheduled for 09/14/15. Phone number given for a call back so details instructions can be given. Freddie Dymek J Blayden Conwell, RN 

## 2015-09-14 ENCOUNTER — Ambulatory Visit (HOSPITAL_COMMUNITY): Payer: No Typology Code available for payment source | Attending: Cardiology

## 2015-09-14 ENCOUNTER — Other Ambulatory Visit: Payer: Self-pay

## 2015-09-14 ENCOUNTER — Telehealth: Payer: Self-pay | Admitting: Cardiovascular Disease

## 2015-09-14 ENCOUNTER — Ambulatory Visit (HOSPITAL_BASED_OUTPATIENT_CLINIC_OR_DEPARTMENT_OTHER): Payer: No Typology Code available for payment source

## 2015-09-14 DIAGNOSIS — R0600 Dyspnea, unspecified: Secondary | ICD-10-CM

## 2015-09-14 DIAGNOSIS — R0609 Other forms of dyspnea: Secondary | ICD-10-CM | POA: Diagnosis not present

## 2015-09-14 DIAGNOSIS — R5383 Other fatigue: Secondary | ICD-10-CM | POA: Diagnosis not present

## 2015-09-14 DIAGNOSIS — E785 Hyperlipidemia, unspecified: Secondary | ICD-10-CM | POA: Insufficient documentation

## 2015-09-14 DIAGNOSIS — I119 Hypertensive heart disease without heart failure: Secondary | ICD-10-CM | POA: Insufficient documentation

## 2015-09-14 DIAGNOSIS — Z87891 Personal history of nicotine dependence: Secondary | ICD-10-CM | POA: Diagnosis not present

## 2015-09-14 DIAGNOSIS — I10 Essential (primary) hypertension: Secondary | ICD-10-CM | POA: Diagnosis not present

## 2015-09-14 DIAGNOSIS — R079 Chest pain, unspecified: Secondary | ICD-10-CM

## 2015-09-14 DIAGNOSIS — Z8249 Family history of ischemic heart disease and other diseases of the circulatory system: Secondary | ICD-10-CM | POA: Insufficient documentation

## 2015-09-14 LAB — MYOCARDIAL PERFUSION IMAGING
CHL CUP NUCLEAR SRS: 4
CHL CUP NUCLEAR SSS: 4
LV dias vol: 98 mL (ref 62–150)
LV sys vol: 45 mL
Peak HR: 104 {beats}/min
RATE: 0.32
Rest HR: 58 {beats}/min
SDS: 0
TID: 1.04

## 2015-09-14 MED ORDER — REGADENOSON 0.4 MG/5ML IV SOLN
0.4000 mg | Freq: Once | INTRAVENOUS | Status: AC
  Administered 2015-09-14: 0.4 mg via INTRAVENOUS

## 2015-09-14 MED ORDER — TECHNETIUM TC 99M SESTAMIBI GENERIC - CARDIOLITE
10.8000 | Freq: Once | INTRAVENOUS | Status: AC | PRN
  Administered 2015-09-14: 11 via INTRAVENOUS

## 2015-09-14 MED ORDER — TECHNETIUM TC 99M SESTAMIBI GENERIC - CARDIOLITE
32.5000 | Freq: Once | INTRAVENOUS | Status: AC | PRN
  Administered 2015-09-14: 33 via INTRAVENOUS

## 2015-09-14 NOTE — Telephone Encounter (Signed)
Having a problems with the Elavil - med is giving me a upset stomach and bones are hurting.  What can I take in place of this med.

## 2015-09-14 NOTE — Telephone Encounter (Signed)
Pt states he started Zetia around 08/30/15. Pt states he developed nausea, bone and shoulder pain after starting Zetia. Pt states these are the same symptoms he had when he was taking lipitor.   Pt states he stopped Zetia about 2 days ago, symptoms are resolving and he is feeling better.   Pt advised I will forward to Dr Elease HashimotoNahser for review.

## 2015-09-17 NOTE — Telephone Encounter (Signed)
Left message on wife's phone (DPR) that patient may continue to hold zetia and to call us in 2-3 weeks to let us know how he is feeling.

## 2015-09-17 NOTE — Telephone Encounter (Signed)
Ok to hold Zetia

## 2015-09-20 ENCOUNTER — Telehealth: Payer: Self-pay | Admitting: Cardiovascular Disease

## 2015-09-20 NOTE — Telephone Encounter (Signed)
I have discussed with Eligha BridegroomMichelle Swinyer, RN. She had discussed the possibility of volume depletion. His CP are also concerning. appearantly he is feeling worse after we added HCTZ See if he can see me today  Check BMP. Consider cath if his CP is c/w angina

## 2015-09-20 NOTE — Telephone Encounter (Signed)
Mrs. Charles Leblanc is calling because Mr. Charles Leblanc is on Potassium and another medication(not sure whats the name) and he came home work very sweaty and cold and clamy and kept having bad cramps in his legs on last night. Please call .   Thanks

## 2015-09-20 NOTE — Telephone Encounter (Signed)
Attempted to call patient's wife at work - phone call would not go through Left message on wife's cell for patient to call back to be seen at 3:15 today

## 2015-09-20 NOTE — Telephone Encounter (Signed)
Spoke with patient's wife who states she did not get our message about 3:15 appointment, states patient came home early from work today.  I asked her to get him on the phone so I could get better detail from the patient.  He wanted her to talk to me but I could hear him from the background answering questions.  He states he got dizzy, was sweating profusely, had difficulty breathing and his chest was hurting while working.  He states the chest pain does not occur on its own, states it only occurs when the other symptoms occur.  He states the nurse at work looked at him and suggested he was dehydrated.  I asked out about his urine output and he says his urine is dark orange.  I placed the patient and wife on hold and discussed with Dr. Elease HashimotoNahser who is in the office.  He states he believes the patient is volume depleted and should hold HCTZ and potassium and push fluids over the next few days.  Wife states he drinks a gallon of tea and several gatorades per day.  I advised that he should drink at least 64 oz of water per day in addition to some tea and gatorade.  I advised that tea and gatorade do no need to be his primary sources of fluids.  We discussed his diet and it does not seem that he eats a high sodium diet, wife states she does not most of the cooking and he takes a home packed lunch to work.  I advised her to push fluids while holding HCTZ and potassium over the next few days and call back Monday to report.  I advised her htat Dr. Elease HashimotoNahser will not be in the office tomorrow but to call triage with questions or concerns.  She verbalized understanding and agreement with plan of care.

## 2015-09-20 NOTE — Telephone Encounter (Signed)
Spoke with patient's wife who states patient came in from working 12 hours yesterday with c/o fatigue, chest pain, sweating, cold and clammy.  She states he did not sleep well last night because of leg cramps.  He also complains of headache.  She states he has been wearing his cpap for sleep apnea and sometimes it causes him to have a headache when he first wakes up.  She states he has not been feeling well lately and is unable to do the things he used to be able to do.  Main concern is fatigue, however he does have intermittent chest pain.  I verified that he is taking medications as directed.  She states his recent stress test was normal which I confirmed but advised that patient could still have narrowing of arteries or other issues which is why it is important to listen to the patient's symptoms.  I advised her that I will discuss with Dr. Elease HashimotoNahser who is in the office today and call her back with his advice.  She verbalized understanding and agreement.

## 2015-09-20 NOTE — Telephone Encounter (Signed)
Left message on home number because unable to get through on work number

## 2015-09-25 NOTE — Telephone Encounter (Signed)
Left message for patient's wife to call back to report how patient is doing

## 2015-09-26 NOTE — Progress Notes (Addendum)
Cardiology Office Note    Date:  09/27/2015   ID:  Charles Leblanc, DOB 1960-07-11, MRN [id number]  PCP:  Eloisa NorthernAMIN, SAAD, MD  Cardiologist:  Dr.  Elease HashimotoNahser  CC: Chest pain and fatigue   History of Present Illness:  Charles Leblanc is a 55 y.o. male with a history of HTN, HLD, GERD, previous tobacco abuse, COPD and OSA who presents to clinic for evaluation of chest pain and fatigue.   He was seen by Dr. Elease HashimotoNahser on 08/29/15 for evaluation of chest pain, dyspnea and fatigue. His BP was noted to be elevated. He was started on Coreg 12.5mg  BID, HCTZ 25mg  daily and Kdur 20Meq. 2D ECHO and stress test were normal. Lipids were very elevated (LDL 186) and started on atorvastatin 40mg  daily.   Since that time, he had an episode of dizziness, diaphoresis and chest pain for which he called the office. He also had severe leg cramping.  It was felt he was volume depleted and told to stop HCTZ and Kdur. He has since restarted on this and has been pushing fluids and feeling better. Dr. Melburn PopperNasher suggested that he be added onto flex for further evaluation.   Overall, he feels just overwhelmingly fatigued. He had chest pain last night while sitting down watching TV. It comes and goes. Nothing makes it better or worse. 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 He gets worse chest pain with exertion. Chest pain is associated with SOB. No nausea or diaphoresis.  He has had decreased exercise tolerance. He also feels depressed due to not feeling well. No Le edema, orthopnea or PND. No dizziness or syncope.   His father had bypass surgery    Past Medical History  Diagnosis Date  . GERD (gastroesophageal reflux disease)   . Arthritis   . Hypertension   . Hyperlipidemia     No past surgical history on file.  Current Medications: Outpatient Prescriptions Prior to Visit  Medication Sig Dispense Refill  . amitriptyline (ELAVIL) 25 MG tablet Take 25 mg by mouth at bedtime.    .  budesonide-formoterol (SYMBICORT) 160-4.5 MCG/ACT inhaler Inhale 1 puff into the lungs 2 (two) times daily.    . carvedilol (COREG) 12.5 MG tablet Take 1 tablet (12.5 mg total) by mouth 2 (two) times daily. 60 tablet 11  . hydrochlorothiazide (HYDRODIURIL) 25 MG tablet Take 1 tablet (25 mg total) by mouth daily. 30 tablet 11  . meloxicam (MOBIC) 7.5 MG tablet Take 7.5 mg by mouth 2 (two) times daily.     Marland Kitchen. omeprazole (PRILOSEC) 20 MG capsule Take 20 mg by mouth daily.    . OXYGEN Inhale into the lungs at bedtime. CPAP MASK AND MACHINE AT BEDTIME FOR SLEEP APNEA    . potassium chloride SA (K-DUR,KLOR-CON) 20 MEQ tablet Take 1 tablet (20 mEq total) by mouth daily. 30 tablet 11  . PROAIR HFA 108 (90 Base) MCG/ACT inhaler Inhale 2 puffs into the lungs every 4 (four) hours as needed for wheezing or shortness of breath.   2  . traMADol (ULTRAM) 50 MG tablet Take 50 mg by mouth every 6 (six) hours as needed for moderate pain.     Marland Kitchen. venlafaxine (EFFEXOR) 37.5 MG tablet Take 37.5 mg by mouth daily.     No facility-administered medications prior to visit.     Allergies:   Lipitor   Social History   Social History  . Marital Status: Married    Spouse Name: N/A  .  Number of Children: N/A  . Years of Education: N/A   Occupational History  . [employer]- makes plastic cups    Social History Main Topics  . Smoking status: Former Smoker -- 2.00 packs/day for 20 years    Types: Cigarettes    Quit date: 05/26/2014  . Smokeless tobacco: Never Used  . Alcohol Use: No  . Drug Use: No  . Sexual Activity: Not Asked   Other Topics Concern  . None   Social History Narrative     Family History:  The patient's family history includes Breast cancer in his sister; Colon cancer in his father; Heart failure in his father.   ROS:   Please see the history of present illness.    ROS All other systems reviewed and are negative.   PHYSICAL EXAM:   VS:  BP 134/86 mmHg  Pulse 65  Ht 5'  10" (1.778 m)  Wt 196 lb 12.8 oz (89.268 kg)  BMI 28.24 kg/m2   GEN: Well nourished, well developed, in no acute distress HEENT: normal Neck: no JVD, carotid bruits, or masses Cardiac: RRR; no murmurs, rubs, or gallops,no edema  Respiratory:  clear to auscultation bilaterally, normal work of breathing GI: soft, nontender, nondistended, + BS MS: no deformity or atrophy Skin: warm and dry, no rash Neuro:  Alert and Oriented x 3, Strength and sensation are intact Psych: euthymic mood, full affect  Wt Readings from Last 3 Encounters:  09/27/15 196 lb 12.8 oz (89.268 kg)  08/29/15 199 lb (90.266 kg)  07/26/15 200 lb (90.719 kg)      Studies/Labs Reviewed:   EKG:  EKG is ordered today.  The ekg ordered today demonstrates NSE  Recent Labs: 08/29/2015: ALT 13; BUN 12; Creat 1.23; Hemoglobin 15.7; Platelets 295; Potassium 4.5; Sodium 138; TSH 1.13   Lipid Panel    Component Value Date/Time   CHOL 257* 08/29/2015 0939   TRIG 92 08/29/2015 0939   HDL 53 08/29/2015 0939   CHOLHDL 4.8 08/29/2015 0939   VLDL 18 08/29/2015 0939   LDLCALC 186* 08/29/2015 0939    Additional studies/ records that were reviewed today include:   09/14/15 Study Highlights     Nuclear stress EF: 54%.  There was no ST segment deviation noted during stress.  This is a low risk study.  The left ventricular ejection fraction is mildly decreased (45-54%).  1. EF 54%. Visually appears normal with normal wall motion.  2. Small, mild basal to mid inferior perfusion defect. The defect actually looks worse at rest. No ischemia. Suspect most likely diaphragmatic attenuation given normal wall motion.      2D ECHO: 09/14/2015 LV EF: 55% - 60% Study Conclusions - Left ventricle: The cavity size was normal. There was mild focal  basal hypertrophy of the septum. Systolic function was normal.  The estimated ejection fraction was in the range of 55% to 60%.  Wall motion was normal; there were no  regional wall motion  abnormalities. Left ventricular diastolic function parameters  were normal.   ASSESSMENT:    1. Chest pain, unspecified chest pain type   2. HLD (hyperlipidemia)   3. Essential hypertension   4. Chronic obstructive pulmonary disease, unspecified COPD type (HCC)      PLAN:  In order of problems listed above:  Chest pain: patient had recent normal nuclear stress test but continues to have worrisome symptoms. He has several RFs for CAD including HTN, HLD, previous tobacco abuse and family history in his  father. WIll set him up for coronary angiography for definitive evaluation of coronary anatomy.   I have reviewed the risks, indications, and alternatives to cardiac catheterization and possible angioplasty/stenting with the patient. Risks include but are not limited to bleeding, infection, vascular injury, stroke, myocardial infection, arrhythmia, kidney injury, radiation-related injury in the case of prolonged fluoroscopy use, emergency cardiac surgery, and death. The patient understands the risks of serious complication is low (<1%).    HLD: significantly elevated LDL and recently started on atorvastatin  daily.   HTN: felt dehydrated and had cramps with HCTZ. Now back on this and pushing fluids. Continue coreg 12.5mg  BID   COPD: could be partiallty resposible for DOE. Heart cath to rule out ischemia.    Medication Adjustments/Labs and Tests Ordered: Current medicines are reviewed at length with the patient today.  Concerns regarding medicines are outlined above.  Medication changes, Labs and Tests ordered today are listed in the Patient Instructions below. Patient Instructions  Medication Instructions:  Your physician recommends that you continue on your current medications as directed. Please refer to the Current Medication list given to you today.   Labwork: TODAY:  BMET, CBC W/DIFF & PT/INR  Testing/Procedures: Your physician has requested that  you have a cardiac catheterization. Cardiac catheterization is used to diagnose and/or treat various heart conditions. Doctors may recommend this procedure for a number of different reasons. The most common reason is to evaluate chest pain. Chest pain can be a symptom of coronary artery disease (CAD), and cardiac catheterization can show whether plaque is narrowing or blocking your heart's arteries. This procedure is also used to evaluate the valves, as well as measure the blood flow and oxygen levels in different parts of your heart. For further information please visit [email or web address] Please follow instruction sheet, as given.    Follow-Up: Your physician recommends that you schedule a follow-up appointment in: WILL BE SET UP AT DISCHARGE   Any Other Special Instructions Will Be Listed Below (If Applicable). Coronary Angiogram A coronary angiogram, also called coronary angiography, is an X-ray procedure used to look at the arteries in the heart. In this procedure, a dye (contrast dye) is injected through a long, hollow tube (catheter). The catheter is about the size of a piece of cooked spaghetti and is inserted through your groin, wrist, or arm. The dye is injected into each artery, and X-rays are then taken to show if there is a blockage in the arteries of your heart. LET [facility] CARE PROVIDER KNOW ABOUT:  Any allergies you have, including allergies to shellfish or contrast dye.   All medicines you are taking, including vitamins, herbs, eye drops, creams, and over-the-counter medicines.   Previous problems you or members of your family have had with the use of anesthetics.   Any blood disorders you have.   Previous surgeries you have had.  History of kidney problems or failure.   Other medical conditions you have. RISKS AND COMPLICATIONS  Generally, a coronary angiogram is a safe procedure. However, problems can occur and include:  Allergic reaction to the  dye.  Bleeding from the access site or other locations.  Kidney injury, especially in people with impaired kidney function.  Stroke (rare).  Heart attack (rare). BEFORE THE PROCEDURE   Do not eat or drink anything after midnight the night before the procedure or as directed by your health care provider.   Ask your health care provider about changing or stopping your regular medicines. This is especially important  if you are taking diabetes medicines or blood thinners. PROCEDURE  You may be given a medicine to help you relax (sedative) before the procedure. This medicine is given through an intravenous (IV) access tube that is inserted into one of your veins.   The area where the catheter will be inserted will be washed and shaved. This is usually done in the groin but may be done in the fold of your arm (near your elbow) or in the wrist.   A medicine will be given to numb the area where the catheter will be inserted (local anesthetic).   The health care provider will insert the catheter into an artery. The catheter will be guided by using a special type of X-ray (fluoroscopy) of the blood vessel being examined.   A special dye will then be injected into the catheter, and X-rays will be taken. The dye will help to show where any narrowing or blockages are located in the heart arteries.  AFTER THE PROCEDURE   If the procedure is done through the leg, you will be kept in bed lying flat for several hours. You will be instructed to not bend or cross your legs.  The insertion site will be checked frequently.   The pulse in your feet or wrist will be checked frequently.   Additional blood tests, X-rays, and an electrocardiogram may be done.    This information is not intended to replace advice given to you by your health care provider. Make sure you discuss any questions you have with your health care provider.   Document Released: 11/16/2002 Document Revised: 06/02/2014  Document Reviewed: 10/04/2012 Elsevier Interactive Patient Education [employer]     If you need a refill on your cardiac medications before your next appointment, please call your pharmacy.       Charlestine Massed  09/27/2015 11:37 AM    [facility] [address] [place], [place], [place]  [address] Phone: [phone]; Fax: [phone]

## 2015-09-26 NOTE — Telephone Encounter (Signed)
Spoke with patient's wife.  She states the patient felt some better after increasing fluid intake over the weekend but went back to work yesterday and today is feeling very weak.  She states he told her he was going to call us because he was "just not feeling right."  He resumed HCTZ and potassium today.   I discussed with Dr. Elease HashimotoNahser who is in the office today and he advised that patient should come in for appointment tomorrow with APP as he will be off.  I scheduled patient at 11:00 with FLEX APP.  Wife verbalized understanding and thanked me for the call.

## 2015-09-27 ENCOUNTER — Encounter: Payer: Self-pay | Admitting: *Deleted

## 2015-09-27 ENCOUNTER — Ambulatory Visit (INDEPENDENT_AMBULATORY_CARE_PROVIDER_SITE_OTHER): Payer: No Typology Code available for payment source | Admitting: Physician Assistant

## 2015-09-27 ENCOUNTER — Encounter: Payer: Self-pay | Admitting: Physician Assistant

## 2015-09-27 VITALS — BP 134/86 | HR 65 | Ht 70.0 in | Wt 196.8 lb

## 2015-09-27 DIAGNOSIS — R079 Chest pain, unspecified: Secondary | ICD-10-CM | POA: Diagnosis not present

## 2015-09-27 DIAGNOSIS — E785 Hyperlipidemia, unspecified: Secondary | ICD-10-CM

## 2015-09-27 DIAGNOSIS — J449 Chronic obstructive pulmonary disease, unspecified: Secondary | ICD-10-CM | POA: Diagnosis not present

## 2015-09-27 DIAGNOSIS — I10 Essential (primary) hypertension: Secondary | ICD-10-CM

## 2015-09-27 LAB — CBC WITH DIFFERENTIAL/PLATELET
BASOS ABS: 67 {cells}/uL (ref 0–200)
Basophils Relative: 1 %
EOS ABS: 402 {cells}/uL (ref 15–500)
EOS PCT: 6 %
HCT: 42.2 % (ref 38.5–50.0)
HEMOGLOBIN: 14.8 g/dL (ref 13.2–17.1)
LYMPHS ABS: 2345 {cells}/uL (ref 850–3900)
Lymphocytes Relative: 35 %
MCH: 30.1 pg (ref 27.0–33.0)
MCHC: 35.1 g/dL (ref 32.0–36.0)
MCV: 85.9 fL (ref 80.0–100.0)
MPV: 10.1 fL (ref 7.5–12.5)
Monocytes Absolute: 804 cells/uL (ref 200–950)
Monocytes Relative: 12 %
NEUTROS ABS: 3082 {cells}/uL (ref 1500–7800)
NEUTROS PCT: 46 %
Platelets: 255 10*3/uL (ref 140–400)
RBC: 4.91 MIL/uL (ref 4.20–5.80)
RDW: 12.5 % (ref 11.0–15.0)
WBC: 6.7 10*3/uL (ref 3.8–10.8)

## 2015-09-27 LAB — BASIC METABOLIC PANEL
BUN: 8 mg/dL (ref 7–25)
CALCIUM: 9.7 mg/dL (ref 8.6–10.3)
CHLORIDE: 98 mmol/L (ref 98–110)
CO2: 29 mmol/L (ref 20–31)
CREATININE: 1.02 mg/dL (ref 0.70–1.33)
GLUCOSE: 90 mg/dL (ref 65–99)
Potassium: 4.5 mmol/L (ref 3.5–5.3)
SODIUM: 137 mmol/L (ref 135–146)

## 2015-09-27 LAB — PROTIME-INR
INR: 0.96 (ref <1.50)
Prothrombin Time: 12.9 seconds (ref 11.6–15.2)

## 2015-09-27 NOTE — Patient Instructions (Signed)
Medication Instructions:  Your physician recommends that you continue on your current medications as directed. Please refer to the Current Medication list given to you today.   Labwork: TODAY:  BMET, CBC W/DIFF & PT/INR  Testing/Procedures: Your physician has requested that you have a cardiac catheterization. Cardiac catheterization is used to diagnose and/or treat various heart conditions. Doctors may recommend this procedure for a number of different reasons. The most common reason is to evaluate chest pain. Chest pain can be a symptom of coronary artery disease (CAD), and cardiac catheterization can show whether plaque is narrowing or blocking your heart's arteries. This procedure is also used to evaluate the valves, as well as measure the blood flow and oxygen levels in different parts of your heart. For further information please visit www.[email or web address]. Please follow instruction sheet, as given.   Follow-Up: Your physician recommends that you schedule a follow-up appointment in: WILL BE SET UP AT DISCHARGE   Any Other Special Instructions Will Be Listed Below (If Applicable). Coronary Angiogram A coronary angiogram, also called coronary angiography, is an X-ray procedure used to look at the arteries in the heart. In this procedure, a dye (contrast dye) is injected through a long, hollow tube (catheter). The catheter is about the size of a piece of cooked spaghetti and is inserted through your groin, wrist, or arm. The dye is injected into each artery, and X-rays are then taken to show if there is a blockage in the arteries of your heart. LET YOUR HEALTH CARE PROVIDER KNOW ABOUT:  Any allergies you have, including allergies to shellfish or contrast dye.   All medicines you are taking, including vitamins, herbs, eye drops, creams, and over-the-counter medicines.   Previous problems you or members of your family have had with the use of anesthetics.   Any blood disorders you have.    Previous surgeries you have had.  History of kidney problems or failure.   Other medical conditions you have. RISKS AND COMPLICATIONS  Generally, a coronary angiogram is a safe procedure. However, problems can occur and include:  Allergic reaction to the dye.  Bleeding from the access site or other locations.  Kidney injury, especially in people with impaired kidney function.  Stroke (rare).  Heart attack (rare). BEFORE THE PROCEDURE   Do not eat or drink anything after midnight the night before the procedure or as directed by your health care provider.   Ask your health care provider about changing or stopping your regular medicines. This is especially important if you are taking diabetes medicines or blood thinners. PROCEDURE  You may be given a medicine to help you relax (sedative) before the procedure. This medicine is given through an intravenous (IV) access tube that is inserted into one of your veins.   The area where the catheter will be inserted will be washed and shaved. This is usually done in the groin but may be done in the fold of your arm (near your elbow) or in the wrist.   A medicine will be given to numb the area where the catheter will be inserted (local anesthetic).   The health care provider will insert the catheter into an artery. The catheter will be guided by using a special type of X-ray (fluoroscopy) of the blood vessel being examined.   A special dye will then be injected into the catheter, and X-rays will be taken. The dye will help to show where any narrowing or blockages are located in the heart arteries.  AFTER THE   PROCEDURE   If the procedure is done through the leg, you will be kept in bed lying flat for several hours. You will be instructed to not bend or cross your legs.  The insertion site will be checked frequently.   The pulse in your feet or wrist will be checked frequently.   Additional blood tests, X-rays, and an  electrocardiogram may be done.    This information is not intended to replace advice given to you by your health care provider. Make sure you discuss any questions you have with your health care provider.   Document Released: 11/16/2002 Document Revised: 06/02/2014 Document Reviewed: 10/04/2012 Elsevier Interactive Patient Education 2016 Elsevier Inc.   If you need a refill on your cardiac medications before your next appointment, please call your pharmacy.   

## 2015-09-28 ENCOUNTER — Encounter (HOSPITAL_COMMUNITY)
Admission: RE | Disposition: A | Discharge status: Home / Self Care | Payer: Self-pay | Source: Ambulatory Visit | Attending: Interventional Cardiology

## 2015-09-28 ENCOUNTER — Ambulatory Visit (HOSPITAL_COMMUNITY)
Admission: RE | Admit: 2015-09-28 | Discharge: 2015-09-28 | Disposition: A | Discharge status: Home / Self Care | Payer: No Typology Code available for payment source | Source: Ambulatory Visit | Attending: Interventional Cardiology | Admitting: Interventional Cardiology

## 2015-09-28 ENCOUNTER — Encounter (HOSPITAL_COMMUNITY): Payer: Self-pay | Admitting: Interventional Cardiology

## 2015-09-28 DIAGNOSIS — I2510 Atherosclerotic heart disease of native coronary artery without angina pectoris: Secondary | ICD-10-CM | POA: Diagnosis not present

## 2015-09-28 DIAGNOSIS — J449 Chronic obstructive pulmonary disease, unspecified: Secondary | ICD-10-CM | POA: Diagnosis not present

## 2015-09-28 DIAGNOSIS — K219 Gastro-esophageal reflux disease without esophagitis: Secondary | ICD-10-CM | POA: Insufficient documentation

## 2015-09-28 DIAGNOSIS — Z7951 Long term (current) use of inhaled steroids: Secondary | ICD-10-CM | POA: Insufficient documentation

## 2015-09-28 DIAGNOSIS — R079 Chest pain, unspecified: Secondary | ICD-10-CM | POA: Diagnosis not present

## 2015-09-28 DIAGNOSIS — I10 Essential (primary) hypertension: Secondary | ICD-10-CM | POA: Insufficient documentation

## 2015-09-28 DIAGNOSIS — G4733 Obstructive sleep apnea (adult) (pediatric): Secondary | ICD-10-CM | POA: Insufficient documentation

## 2015-09-28 DIAGNOSIS — Z87891 Personal history of nicotine dependence: Secondary | ICD-10-CM | POA: Insufficient documentation

## 2015-09-28 DIAGNOSIS — E785 Hyperlipidemia, unspecified: Secondary | ICD-10-CM | POA: Diagnosis not present

## 2015-09-28 DIAGNOSIS — I739 Peripheral vascular disease, unspecified: Secondary | ICD-10-CM | POA: Insufficient documentation

## 2015-09-28 DIAGNOSIS — Z8249 Family history of ischemic heart disease and other diseases of the circulatory system: Secondary | ICD-10-CM | POA: Diagnosis not present

## 2015-09-28 DIAGNOSIS — M1990 Unspecified osteoarthritis, unspecified site: Secondary | ICD-10-CM | POA: Diagnosis not present

## 2015-09-28 DIAGNOSIS — R072 Precordial pain: Secondary | ICD-10-CM | POA: Diagnosis not present

## 2015-09-28 HISTORY — PX: CARDIAC CATHETERIZATION: SHX172

## 2015-09-28 SURGERY — LEFT HEART CATH AND CORONARY ANGIOGRAPHY

## 2015-09-28 MED ORDER — ASPIRIN 81 MG PO CHEW
CHEWABLE_TABLET | ORAL | Status: AC
  Administered 2015-09-28: 81 mg via ORAL
  Filled 2015-09-28: qty 1

## 2015-09-28 MED ORDER — SODIUM CHLORIDE 0.9% FLUSH: 3.0000 mL | Freq: Two times a day (BID) | INTRAVENOUS | Status: DC

## 2015-09-28 MED ORDER — SODIUM CHLORIDE 0.9 % WEIGHT BASED INFUSION: 1.0000 mL/kg/h | INTRAVENOUS | Status: DC

## 2015-09-28 MED ORDER — FENTANYL CITRATE (PF) 100 MCG/2ML IJ SOLN
INTRAMUSCULAR | Status: DC | PRN
  Administered 2015-09-28 (×2): 25 ug via INTRAVENOUS

## 2015-09-28 MED ORDER — SODIUM CHLORIDE 0.9 % WEIGHT BASED INFUSION: 1.0000 mL/kg/h | INTRAVENOUS | Status: AC

## 2015-09-28 MED ORDER — SODIUM CHLORIDE 0.9 % IV SOLN: 250.0000 mL | INTRAVENOUS | Status: DC | PRN

## 2015-09-28 MED ORDER — IOPAMIDOL (ISOVUE-370) INJECTION 76%
INTRAVENOUS | Status: DC | PRN
  Administered 2015-09-28: 40 mL via INTRA_ARTERIAL

## 2015-09-28 MED ORDER — MIDAZOLAM HCL 2 MG/2ML IJ SOLN
INTRAMUSCULAR | Status: AC
  Filled 2015-09-28: qty 2

## 2015-09-28 MED ORDER — NITROGLYCERIN 1 MG/10 ML FOR IR/CATH LAB
INTRA_ARTERIAL | Status: AC
  Filled 2015-09-28: qty 10

## 2015-09-28 MED ORDER — METOPROLOL TARTRATE 5 MG/5ML IV SOLN
INTRAVENOUS | Status: AC
  Filled 2015-09-28: qty 10

## 2015-09-28 MED ORDER — FENTANYL CITRATE (PF) 100 MCG/2ML IJ SOLN
INTRAMUSCULAR | Status: AC
  Filled 2015-09-28: qty 2

## 2015-09-28 MED ORDER — SODIUM CHLORIDE 0.9 % WEIGHT BASED INFUSION
3.0000 mL/kg/h | INTRAVENOUS | Status: DC
  Administered 2015-09-28: 3 mL/kg/h via INTRAVENOUS

## 2015-09-28 MED ORDER — HEPARIN (PORCINE) IN NACL 2-0.9 UNIT/ML-% IJ SOLN
INTRAMUSCULAR | Status: AC
  Filled 2015-09-28: qty 500

## 2015-09-28 MED ORDER — ASPIRIN 81 MG PO CHEW
81.0000 mg | CHEWABLE_TABLET | ORAL | Status: AC
  Administered 2015-09-28: 81 mg via ORAL

## 2015-09-28 MED ORDER — HEPARIN (PORCINE) IN NACL 2-0.9 UNIT/ML-% IJ SOLN
INTRAMUSCULAR | Status: AC
  Filled 2015-09-28: qty 1000

## 2015-09-28 MED ORDER — MIDAZOLAM HCL 2 MG/2ML IJ SOLN
INTRAMUSCULAR | Status: DC | PRN
  Administered 2015-09-28: 1 mg via INTRAVENOUS
  Administered 2015-09-28: 2 mg via INTRAVENOUS

## 2015-09-28 MED ORDER — IOPAMIDOL (ISOVUE-370) INJECTION 76%
INTRAVENOUS | Status: AC
  Filled 2015-09-28: qty 100

## 2015-09-28 MED ORDER — LIDOCAINE HCL (PF) 1 % IJ SOLN
INTRAMUSCULAR | Status: DC | PRN
  Administered 2015-09-28: 2 mL via SUBCUTANEOUS

## 2015-09-28 MED ORDER — HEPARIN SODIUM (PORCINE) 1000 UNIT/ML IJ SOLN
INTRAMUSCULAR | Status: AC
  Filled 2015-09-28: qty 1

## 2015-09-28 MED ORDER — LIDOCAINE HCL (PF) 1 % IJ SOLN
INTRAMUSCULAR | Status: AC
  Filled 2015-09-28: qty 30

## 2015-09-28 MED ORDER — HEPARIN (PORCINE) IN NACL 2-0.9 UNIT/ML-% IJ SOLN
INTRAMUSCULAR | Status: DC | PRN
  Administered 2015-09-28: 1500 mL

## 2015-09-28 MED ORDER — SODIUM CHLORIDE 0.9% FLUSH: 3.0000 mL | INTRAVENOUS | Status: DC | PRN

## 2015-09-28 MED ORDER — HEPARIN (PORCINE) IN NACL 2-0.9 UNIT/ML-% IJ SOLN
INTRAMUSCULAR | Status: DC | PRN
  Administered 2015-09-28: 10 mL via INTRA_ARTERIAL

## 2015-09-28 MED ORDER — NITROGLYCERIN 1 MG/10 ML FOR IR/CATH LAB
INTRA_ARTERIAL | Status: DC | PRN
  Administered 2015-09-28 (×2): 200 ug via INTRA_ARTERIAL

## 2015-09-28 MED ORDER — HEPARIN SODIUM (PORCINE) 1000 UNIT/ML IJ SOLN
INTRAMUSCULAR | Status: DC | PRN
  Administered 2015-09-28: 4500 [IU] via INTRAVENOUS

## 2015-09-28 MED ORDER — VERAPAMIL HCL 2.5 MG/ML IV SOLN
INTRAVENOUS | Status: AC
  Filled 2015-09-28: qty 2

## 2015-09-28 SURGICAL SUPPLY — 9 items
CATH IMPULSE 5F ANG/FL3.5 (CATHETERS) ×3 IMPLANT
DEVICE RAD COMP TR BAND LRG (VASCULAR PRODUCTS) ×3 IMPLANT
GLIDESHEATH SLEND SS 6F .021 (SHEATH) ×3 IMPLANT
KIT HEART LEFT (KITS) ×3 IMPLANT
PACK CARDIAC CATHETERIZATION (CUSTOM PROCEDURE TRAY) ×3 IMPLANT
SYR MEDRAD MARK V 150ML (SYRINGE) ×3 IMPLANT
TRANSDUCER W/STOPCOCK (MISCELLANEOUS) ×3 IMPLANT
TUBING CIL FLEX 10 FLL-RA (TUBING) ×3 IMPLANT
WIRE SAFE-T 1.5MM-J .035X260CM (WIRE) ×3 IMPLANT

## 2015-09-28 NOTE — Discharge Instructions (Signed)
Radial Site Care Refer to this sheet in the next few weeks. These instructions provide you with information about caring for yourself after your procedure. Your health care provider may also give you more specific instructions. Your treatment has been planned according to current medical practices, but problems sometimes occur. Call your health care provider if you have any problems or questions after your procedure. WHAT TO EXPECT AFTER THE PROCEDURE After your procedure, it is typical to have the following:  Bruising at the radial site that usually fades within 1-2 weeks.  Blood collecting in the tissue (hematoma) that may be painful to the touch. It should usually decrease in size and tenderness within 1-2 weeks. HOME CARE INSTRUCTIONS  Take medicines only as directed by your health care provider.  You may shower 24-48 hours after the procedure or as directed by your health care provider. Remove the bandage (dressing) and gently wash the site with plain soap and water. Pat the area dry with a clean towel. Do not rub the site, because this may cause bleeding.  Do not take baths, swim, or use a hot tub until your health care provider approves.  Check your insertion site every day for redness, swelling, or drainage.  Do not apply powder or lotion to the site.  Do not flex or bend the affected arm for 24 hours or as directed by your health care provider.  Do not push or pull heavy objects with the affected arm for 24 hours or as directed by your health care provider.  Do not lift over 10 lb (4.5 kg) for 5 days after your procedure or as directed by your health care provider.  Ask your health care provider when it is okay to:  Return to work or school.  Resume usual physical activities or sports.  Resume sexual activity.  Do not drive home if you are discharged the same day as the procedure. Have someone else drive you.  You may drive 24 hours after the procedure unless otherwise  instructed by your health care provider.  Do not operate machinery or power tools for 24 hours after the procedure.  If your procedure was done as an outpatient procedure, which means that you went home the same day as your procedure, a responsible adult should be with you for the first 24 hours after you arrive home.  Keep all follow-up visits as directed by your health care provider. This is important. SEEK MEDICAL CARE IF:  You have a fever.  You have chills.  You have increased bleeding from the radial site. Hold pressure on the site and call 911. SEEK IMMEDIATE MEDICAL CARE IF:  You have unusual pain at the radial site.  You have redness, warmth, or swelling at the radial site.  You have drainage (other than a small amount of blood on the dressing) from the radial site.  The radial site is bleeding, and the bleeding does not stop after 30 minutes of holding steady pressure on the site.  Your arm or hand becomes pale, cool, tingly, or numb.   This information is not intended to replace advice given to you by your health care provider. Make sure you discuss any questions you have with your health care provider.   Document Released: 06/14/2010 Document Revised: 06/02/2014 Document Reviewed: 11/28/2013 Elsevier Interactive Patient Education [employer]  Return to Work __[name][facility] INJURY OR ILLNESS WAS: _____ Work related __X___ Not work related _____ Undetermined if work related RETURN TO WORK  Employee may return to work on __Wednesday May 10, 2017_.  Employee may return to modified work on ____________________. WORK ACTIVITY RESTRICTIONS Work activities that are not tolerated include: _____ Bending _____ Prolonged sitting _____ Lifting more than ____________________ lb _____  Squatting _____ Prolonged standing _____ Climbing _____ Reaching _____ Pushing and pulling _____ Walking _____ Other ____________________ These restrictions are effective until ____________________. Show this Return to Work statement to your supervisor at work as soon as possible. Your employer should be aware of your condition and can help with the necessary work activity restrictions. If you wish to return to work sooner than the date that is listed above, or if you have further problems that make it difficult for you to return at that time, please call our clinic or your health care provider. __Dr. Vonna KotykJay Varanasi___ Health Care Provider Name (printed) _________________________________________ Health Care Provider (signature)  _05/05/2017____ Date   This information is not intended to replace advice given to you by your health care provider. Make sure you discuss any questions you have with your health care provider.   Document Released: 05/12/2005 Document Revised: 06/02/2014 Document Reviewed: 12/09/2013 Elsevier Interactive Patient Education [employer]

## 2015-09-28 NOTE — Interval H&P Note (Signed)
Cath Lab Visit (complete for each Cath Lab visit)  Clinical Evaluation Leading to the Procedure:   ACS: No.  Non-ACS:    Anginal Classification: CCS III  Anti-ischemic medical therapy: Minimal Therapy (1 class of medications)  Non-Invasive Test Results: Low-risk stress test findings: cardiac mortality <1%/year  Prior CABG: No previous CABG      History and Physical Interval Note:  09/28/2015 7:45 AM  Elnita MaxwellJohn C Cobb  has presented today for surgery, with the diagnosis of cp  The various methods of treatment have been discussed with the patient and family. After consideration of risks, benefits and other options for treatment, the patient has consented to  Procedure(s): Left Heart Cath and Coronary Angiography (N/A) as a surgical intervention .  The patient's history has been reviewed, patient examined, no change in status, stable for surgery.  I have reviewed the patient's chart and labs.  Questions were answered to the patient's satisfaction.     VARANASI,JAYADEEP S.

## 2015-09-28 NOTE — H&P (View-Only) (Signed)
Cardiology Office Note    Date:  09/27/2015   ID:  Charles Leblanc, DOB 1960-07-11, MRN [id number]  PCP:  Eloisa NorthernAMIN, SAAD, MD  Cardiologist:  Dr.  Elease HashimotoNahser  CC: Chest pain and fatigue   History of Present Illness:  Charles Leblanc is a 55 y.o. male with a history of HTN, HLD, GERD, previous tobacco abuse, COPD and OSA who presents to clinic for evaluation of chest pain and fatigue.   He was seen by Dr. Elease HashimotoNahser on 08/29/15 for evaluation of chest pain, dyspnea and fatigue. His BP was noted to be elevated. He was started on Coreg 12.5mg  BID, HCTZ 25mg  daily and Kdur 20Meq. 2D ECHO and stress test were normal. Lipids were very elevated (LDL 186) and started on atorvastatin 40mg  daily.   Since that time, he had an episode of dizziness, diaphoresis and chest pain for which he called the office. He also had severe leg cramping.  It was felt he was volume depleted and told to stop HCTZ and Kdur. He has since restarted on this and has been pushing fluids and feeling better. Dr. Melburn PopperNasher suggested that he be added onto flex for further evaluation.   Overall, he feels just overwhelmingly fatigued. He had chest pain last night while sitting down watching TV. It comes and goes. Nothing makes it better or worse. 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 He gets worse chest pain with exertion. Chest pain is associated with SOB. No nausea or diaphoresis.  He has had decreased exercise tolerance. He also feels depressed due to not feeling well. No Le edema, orthopnea or PND. No dizziness or syncope.   His father had bypass surgery    Past Medical History  Diagnosis Date  . GERD (gastroesophageal reflux disease)   . Arthritis   . Hypertension   . Hyperlipidemia     No past surgical history on file.  Current Medications: Outpatient Prescriptions Prior to Visit  Medication Sig Dispense Refill  . amitriptyline (ELAVIL) 25 MG tablet Take 25 mg by mouth at bedtime.    .  budesonide-formoterol (SYMBICORT) 160-4.5 MCG/ACT inhaler Inhale 1 puff into the lungs 2 (two) times daily.    . carvedilol (COREG) 12.5 MG tablet Take 1 tablet (12.5 mg total) by mouth 2 (two) times daily. 60 tablet 11  . hydrochlorothiazide (HYDRODIURIL) 25 MG tablet Take 1 tablet (25 mg total) by mouth daily. 30 tablet 11  . meloxicam (MOBIC) 7.5 MG tablet Take 7.5 mg by mouth 2 (two) times daily.     Marland Kitchen. omeprazole (PRILOSEC) 20 MG capsule Take 20 mg by mouth daily.    . OXYGEN Inhale into the lungs at bedtime. CPAP MASK AND MACHINE AT BEDTIME FOR SLEEP APNEA    . potassium chloride SA (K-DUR,KLOR-CON) 20 MEQ tablet Take 1 tablet (20 mEq total) by mouth daily. 30 tablet 11  . PROAIR HFA 108 (90 Base) MCG/ACT inhaler Inhale 2 puffs into the lungs every 4 (four) hours as needed for wheezing or shortness of breath.   2  . traMADol (ULTRAM) 50 MG tablet Take 50 mg by mouth every 6 (six) hours as needed for moderate pain.     Marland Kitchen. venlafaxine (EFFEXOR) 37.5 MG tablet Take 37.5 mg by mouth daily.     No facility-administered medications prior to visit.     Allergies:   Lipitor   Social History   Social History  . Marital Status: Married    Spouse Name: N/A  .  Number of Children: N/A  . Years of Education: N/A   Occupational History  . [employer]- makes plastic cups    Social History Main Topics  . Smoking status: Former Smoker -- 2.00 packs/day for 20 years    Types: Cigarettes    Quit date: 05/26/2014  . Smokeless tobacco: Never Used  . Alcohol Use: No  . Drug Use: No  . Sexual Activity: Not Asked   Other Topics Concern  . None   Social History Narrative     Family History:  The patient's family history includes Breast cancer in his sister; Colon cancer in his father; Heart failure in his father.   ROS:   Please see the history of present illness.    ROS All other systems reviewed and are negative.   PHYSICAL EXAM:   VS:  BP 134/86 mmHg  Pulse 65  Ht 5'  10" (1.778 m)  Wt 196 lb 12.8 oz (89.268 kg)  BMI 28.24 kg/m2   GEN: Well nourished, well developed, in no acute distress HEENT: normal Neck: no JVD, carotid bruits, or masses Cardiac: RRR; no murmurs, rubs, or gallops,no edema  Respiratory:  clear to auscultation bilaterally, normal work of breathing GI: soft, nontender, nondistended, + BS MS: no deformity or atrophy Skin: warm and dry, no rash Neuro:  Alert and Oriented x 3, Strength and sensation are intact Psych: euthymic mood, full affect  Wt Readings from Last 3 Encounters:  09/27/15 196 lb 12.8 oz (89.268 kg)  08/29/15 199 lb (90.266 kg)  07/26/15 200 lb (90.719 kg)      Studies/Labs Reviewed:   EKG:  EKG is ordered today.  The ekg ordered today demonstrates NSE  Recent Labs: 08/29/2015: ALT 13; BUN 12; Creat 1.23; Hemoglobin 15.7; Platelets 295; Potassium 4.5; Sodium 138; TSH 1.13   Lipid Panel    Component Value Date/Time   CHOL 257* 08/29/2015 0939   TRIG 92 08/29/2015 0939   HDL 53 08/29/2015 0939   CHOLHDL 4.8 08/29/2015 0939   VLDL 18 08/29/2015 0939   LDLCALC 186* 08/29/2015 0939    Additional studies/ records that were reviewed today include:   09/14/15 Study Highlights     Nuclear stress EF: 54%.  There was no ST segment deviation noted during stress.  This is a low risk study.  The left ventricular ejection fraction is mildly decreased (45-54%).  1. EF 54%. Visually appears normal with normal wall motion.  2. Small, mild basal to mid inferior perfusion defect. The defect actually looks worse at rest. No ischemia. Suspect most likely diaphragmatic attenuation given normal wall motion.      2D ECHO: 09/14/2015 LV EF: 55% - 60% Study Conclusions - Left ventricle: The cavity size was normal. There was mild focal  basal hypertrophy of the septum. Systolic function was normal.  The estimated ejection fraction was in the range of 55% to 60%.  Wall motion was normal; there were no  regional wall motion  abnormalities. Left ventricular diastolic function parameters  were normal.   ASSESSMENT:    1. Chest pain, unspecified chest pain type   2. HLD (hyperlipidemia)   3. Essential hypertension   4. Chronic obstructive pulmonary disease, unspecified COPD type (HCC)      PLAN:  In order of problems listed above:  Chest pain: patient had recent normal nuclear stress test but continues to have worrisome symptoms. He has several RFs for CAD including HTN, HLD, previous tobacco abuse and family history in his  father. WIll set him up for coronary angiography for definitive evaluation of coronary anatomy.   I have reviewed the risks, indications, and alternatives to cardiac catheterization and possible angioplasty/stenting with the patient. Risks include but are not limited to bleeding, infection, vascular injury, stroke, myocardial infection, arrhythmia, kidney injury, radiation-related injury in the case of prolonged fluoroscopy use, emergency cardiac surgery, and death. The patient understands the risks of serious complication is low (<1%).    HLD: significantly elevated LDL and recently started on atorvastatin  daily.   HTN: felt dehydrated and had cramps with HCTZ. Now back on this and pushing fluids. Continue coreg 12.5mg  BID   COPD: could be partiallty resposible for DOE. Heart cath to rule out ischemia.    Medication Adjustments/Labs and Tests Ordered: Current medicines are reviewed at length with the patient today.  Concerns regarding medicines are outlined above.  Medication changes, Labs and Tests ordered today are listed in the Patient Instructions below. Patient Instructions  Medication Instructions:  Your physician recommends that you continue on your current medications as directed. Please refer to the Current Medication list given to you today.   Labwork: TODAY:  BMET, CBC W/DIFF & PT/INR  Testing/Procedures: Your physician has requested that  you have a cardiac catheterization. Cardiac catheterization is used to diagnose and/or treat various heart conditions. Doctors may recommend this procedure for a number of different reasons. The most common reason is to evaluate chest pain. Chest pain can be a symptom of coronary artery disease (CAD), and cardiac catheterization can show whether plaque is narrowing or blocking your heart's arteries. This procedure is also used to evaluate the valves, as well as measure the blood flow and oxygen levels in different parts of your heart. For further information please visit [email or web address] Please follow instruction sheet, as given.    Follow-Up: Your physician recommends that you schedule a follow-up appointment in: WILL BE SET UP AT DISCHARGE   Any Other Special Instructions Will Be Listed Below (If Applicable). Coronary Angiogram A coronary angiogram, also called coronary angiography, is an X-ray procedure used to look at the arteries in the heart. In this procedure, a dye (contrast dye) is injected through a long, hollow tube (catheter). The catheter is about the size of a piece of cooked spaghetti and is inserted through your groin, wrist, or arm. The dye is injected into each artery, and X-rays are then taken to show if there is a blockage in the arteries of your heart. LET [facility] CARE PROVIDER KNOW ABOUT:  Any allergies you have, including allergies to shellfish or contrast dye.   All medicines you are taking, including vitamins, herbs, eye drops, creams, and over-the-counter medicines.   Previous problems you or members of your family have had with the use of anesthetics.   Any blood disorders you have.   Previous surgeries you have had.  History of kidney problems or failure.   Other medical conditions you have. RISKS AND COMPLICATIONS  Generally, a coronary angiogram is a safe procedure. However, problems can occur and include:  Allergic reaction to the  dye.  Bleeding from the access site or other locations.  Kidney injury, especially in people with impaired kidney function.  Stroke (rare).  Heart attack (rare). BEFORE THE PROCEDURE   Do not eat or drink anything after midnight the night before the procedure or as directed by your health care provider.   Ask your health care provider about changing or stopping your regular medicines. This is especially important  if you are taking diabetes medicines or blood thinners. PROCEDURE  You may be given a medicine to help you relax (sedative) before the procedure. This medicine is given through an intravenous (IV) access tube that is inserted into one of your veins.   The area where the catheter will be inserted will be washed and shaved. This is usually done in the groin but may be done in the fold of your arm (near your elbow) or in the wrist.   A medicine will be given to numb the area where the catheter will be inserted (local anesthetic).   The health care provider will insert the catheter into an artery. The catheter will be guided by using a special type of X-ray (fluoroscopy) of the blood vessel being examined.   A special dye will then be injected into the catheter, and X-rays will be taken. The dye will help to show where any narrowing or blockages are located in the heart arteries.  AFTER THE PROCEDURE   If the procedure is done through the leg, you will be kept in bed lying flat for several hours. You will be instructed to not bend or cross your legs.  The insertion site will be checked frequently.   The pulse in your feet or wrist will be checked frequently.   Additional blood tests, X-rays, and an electrocardiogram may be done.    This information is not intended to replace advice given to you by your health care provider. Make sure you discuss any questions you have with your health care provider.   Document Released: 11/16/2002 Document Revised: 06/02/2014  Document Reviewed: 10/04/2012 Elsevier Interactive Patient Education [employer]     If you need a refill on your cardiac medications before your next appointment, please call your pharmacy.       Charlestine Massed  09/27/2015 11:37 AM    [facility] [address] [place], [place], [place]  [address] Phone: [phone]; Fax: [phone]

## 2015-10-05 ENCOUNTER — Ambulatory Visit: Payer: Self-pay | Admitting: Pulmonary Disease

## 2015-10-05 ENCOUNTER — Telehealth: Payer: Self-pay | Admitting: Pulmonary Disease

## 2015-10-05 MED ORDER — BUDESONIDE-FORMOTEROL FUMARATE 160-4.5 MCG/ACT IN AERO: 1.0000 | INHALATION_SPRAY | Freq: Two times a day (BID) | RESPIRATORY_TRACT | Status: DC

## 2015-10-05 NOTE — Telephone Encounter (Signed)
Ok to keep 01/07/16 appt.  Requesting [facility] on [facility]ort [facility] [address]

## 2015-10-05 NOTE — Telephone Encounter (Signed)
Pt scheduled for PFT and OV 01/07/16 PFT at 3pm and OV at 4pm Pt to talk it over with his wife and will call back to confirm this.  Will await call back

## 2015-10-05 NOTE — Telephone Encounter (Signed)
Noted. Called and verified pharmacy with patient. Symbicort refill sent. Nothing further needed.

## 2015-10-10 ENCOUNTER — Ambulatory Visit (INDEPENDENT_AMBULATORY_CARE_PROVIDER_SITE_OTHER): Payer: No Typology Code available for payment source | Admitting: Pulmonary Disease

## 2015-10-10 ENCOUNTER — Ambulatory Visit (INDEPENDENT_AMBULATORY_CARE_PROVIDER_SITE_OTHER): Payer: No Typology Code available for payment source | Admitting: Acute Care

## 2015-10-10 ENCOUNTER — Encounter: Payer: Self-pay | Admitting: Acute Care

## 2015-10-10 VITALS — BP 142/88 | HR 59 | Ht 70.0 in | Wt 194.0 lb

## 2015-10-10 DIAGNOSIS — R0789 Other chest pain: Secondary | ICD-10-CM | POA: Diagnosis not present

## 2015-10-10 DIAGNOSIS — R0609 Other forms of dyspnea: Secondary | ICD-10-CM

## 2015-10-10 DIAGNOSIS — G4733 Obstructive sleep apnea (adult) (pediatric): Secondary | ICD-10-CM

## 2015-10-10 DIAGNOSIS — J449 Chronic obstructive pulmonary disease, unspecified: Secondary | ICD-10-CM | POA: Diagnosis not present

## 2015-10-10 LAB — PULMONARY FUNCTION TEST
DL/VA % pred: 109 %
DL/VA: 5.18 ml/min/mmHg/L
DLCO UNC: 28.05 ml/min/mmHg
DLCO cor % pred: 83 %
DLCO cor: 28.72 ml/min/mmHg
DLCO unc % pred: 81 %
FEF 25-75 Post: 3.03 L/sec
FEF 25-75 Pre: 2.6 L/sec
FEF2575-%Change-Post: 16 %
FEF2575-%PRED-POST: 88 %
FEF2575-%Pred-Pre: 76 %
FEV1-%Change-Post: 4 %
FEV1-%PRED-POST: 68 %
FEV1-%Pred-Pre: 66 %
FEV1-PRE: 2.65 L
FEV1-Post: 2.75 L
FEV1FVC-%Change-Post: 0 %
FEV1FVC-%Pred-Pre: 103 %
FEV6-%CHANGE-POST: 2 %
FEV6-%PRED-PRE: 66 %
FEV6-%Pred-Post: 68 %
FEV6-PRE: 3.33 L
FEV6-Post: 3.41 L
FEV6FVC-%PRED-POST: 104 %
FEV6FVC-%Pred-Pre: 104 %
FVC-%CHANGE-POST: 3 %
FVC-%PRED-POST: 65 %
FVC-%PRED-PRE: 63 %
FVC-POST: 3.43 L
FVC-Pre: 3.33 L
POST FEV6/FVC RATIO: 100 %
PRE FEV6/FVC RATIO: 100 %
Post FEV1/FVC ratio: 80 %
Pre FEV1/FVC ratio: 80 %
RV % PRED: 118 %
RV: 2.61 L
TLC % pred: 84 %
TLC: 6.17 L

## 2015-10-10 NOTE — Patient Instructions (Addendum)
It is nice to meet you today. Your Pulmonary Function Tests today were essentially normal. Discontinue your Symbicort as it is not helping you. Start Citrucel 1 heaping teaspoon twice daily with large glass of water. Avoid foods that cause gas. Mexican, beans or under cooked vegetables. Continue using your Pro Air as rescue inhaler as needed for shortness of breath and wheezing up to every 6 hours. Continue on CPAP at bedtime.  Goal is to wear for at least 4-6 hours each night for maximal clinical benefit. Continue to work on weight loss, as the link between excess weight  and sleep apnea is well established.  Do not drive if sleepy. Follow up with Dr. Christene Slatese Dios January 07, 2016 as is already scheduled. Please contact office for sooner follow up if symptoms do not improve or worsen or seek emergency care

## 2015-10-10 NOTE — Assessment & Plan Note (Addendum)
Pt. states she is not compliant with his CPAP every night. Plan: Continue on CPAP at bedtime. You appear to be benefiting from the treatment Goal is to wear for at least 4-6 hours each night for maximal clinical benefit. Continue to work on weight loss, as the link between excess weight  and sleep apnea is well established.  Do not drive if sleepy. Follow up with Dr. Christene Slatese Dios Aug. 14, 2017 as is already scheduled. Please contact office for sooner follow up if symptoms do not improve or worsen or seek emergency care

## 2015-10-10 NOTE — Assessment & Plan Note (Addendum)
PFT's  10/10/2015  FEV1 2.75 (68 % ) ratio 80  p 4 % improvement from saba p no rx prior to study with DLCO  81/83c % corrects to 109 % for alv volume     Continued Dyspnea despite Symbicort Describes as a pain " Like being hit in the chest" not limited by sob > see cp    I reviewed the Fletcher curve with the patient that basically indicates  if you quit smoking when your best day FEV1 is still well preserved (as is clearly  the case here where his pfts are wnl)  it is highly unlikely you will progress to significant disease and informed the patient  That the CT scan does not make the diagnosis and is c/w smoking hx but of no clinical consequence nor is it likely to ever be without additional exposure or superimposed asthma which unlikely to have as symptomatic at the pfts and no resp to symbicort

## 2015-10-10 NOTE — Assessment & Plan Note (Addendum)
CT w/cm 07/04/15 no  Central emboli/ minimal emphysema changes  LHC 09/28/15  Ost 2nd Diag lesion, 10% stenosed.  Mid LAD lesion, 20% stenosed.  The left ventricular systolic function is normal.  Normal LVEDP.   Classic subdiaphragmatic pain pattern suggests ibs:  Stereotypical, migratory with a very limited distribution of pain locations, daytime, not exacerbated by ex or coughing, worse in sitting position,  not present supine due to the dome effect of the diaphragm is  canceled in that position. Frequently these patients have had multiple negative cardiac and GI w/u's   Treatment consists of avoiding foods that cause gas (especially beans and raw vegetables like spinach and salads)  and citrucel 1 heaping tsp twice daily with a large glass of water.  Pain should improve w/in 2 weeks and if not then consider further GI work up.     I had an extended discussion with the patient reviewing all relevant studies completed to date and  lasting 25 minutes of a 40  minute extended summary office visit    Each maintenance medication was reviewed in detail including most importantly the difference between maintenance and prns and under what circumstances the prns are to be triggered using an action plan format that is not reflected in the computer generated alphabetically organized AVS.    Please see instructions for details which were reviewed in writing and the patient given a copy highlighting the part that I personally wrote and discussed at today's ov.

## 2015-10-10 NOTE — Progress Notes (Signed)
History of Present Illness Charles Leblanc is a 55 y.o. 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 He quit smoking in 1999, and is followed by Dr. Christene Leblanc   10/10/2015 6 week  follow up visit: Patient presents to the office for 6 week follow-up after starting Symbicort 160/4.5, 2 puffs twice daily.He has had PFT's today, which are normal, and do not indicate any obstruction. He states that the Symbicort really does not help.He is using his Psychologist, forensic for rescue about twice daily.He continues to have chest tightness/discomfort somewhat migratory but always on L ant/ never supine, and worse in the morning after assuming upright postion. Dr. 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 He states he does not wear it due to the mask.He complains of headaches in the morning x approx.. 2 years. He does not want to have evaluation for mask change or nasal prongs  Does not appear limited by sob or chest discomfort/ no pleuritic component/ no resp to symbicort    Tests CT Chest 07/04/2015: mild biapical paraseptal emphysema. No lymphadenopathy seen. No infiltrates seen.  Past medical hx Past Medical History  Diagnosis Date  . GERD (gastroesophageal reflux disease)   . Arthritis   . Hypertension   . Hyperlipidemia      Past surgical hx, Family hx, Social hx all reviewed.  Current Outpatient Prescriptions on File Prior to Visit  Medication Sig  . amitriptyline (ELAVIL) 25 MG tablet Take 25 mg by mouth at bedtime.  . budesonide-formoterol (SYMBICORT) 160-4.5 MCG/ACT inhaler Inhale 1 puff into the lungs 2 (two) times daily.  . carvedilol (COREG) 12.5 MG tablet Take 1 tablet (12.5 mg total) by mouth 2 (two) times daily.  . hydrochlorothiazide (HYDRODIURIL) 25 MG tablet Take 1 tablet (25 mg  total) by mouth daily.  . meloxicam (MOBIC) 7.5 MG tablet Take 7.5 mg by mouth 2 (two) times daily.   . NON FORMULARY at bedtime. CPAP set on #10  . omeprazole (PRILOSEC) 20 MG capsule Take 20 mg by mouth daily as needed.   . potassium chloride SA (K-DUR,KLOR-CON) 20 MEQ tablet Take 1 tablet (20 mEq total) by mouth daily.  Marland Kitchen PROAIR HFA 108 (90 Base) MCG/ACT inhaler Inhale 2 puffs into the lungs every 4 (four) hours as needed for wheezing or shortness of breath.   . traMADol (ULTRAM) 50 MG tablet Take 50 mg by mouth 2 (two) times daily as needed for moderate pain.   Marland Kitchen venlafaxine XR (EFFEXOR-XR) 37.5 MG 24 hr capsule Take 37.5 mg by mouth daily with breakfast.   . [DISCONTINUED] ezetimibe (ZETIA) 10 MG tablet Take 1 tablet (10 mg total) by mouth daily.   No current facility-administered medications on file prior to visit.     Allergies  Allergen Reactions  . Lipitor [Atorvastatin] Other (See Comments)    Migraines, Myalgia    Review Of Systems:  Constitutional:   No  weight loss, night sweats,  Fevers, chills, fatigue, or  lassitude.  HEENT:   No headaches,  Difficulty swallowing,  Tooth/dental problems, or  Sore throat,                No sneezing, itching, ear ache, nasal congestion, post nasal drip,   CV:  + left sided  chest pain, No  Orthopnea, PND, swelling in lower extremities, anasarca, dizziness, palpitations, syncope.   GI  No heartburn, indigestion, abdominal pain, nausea, vomiting, diarrhea, change in bowel habits, loss of appetite, bloody stools.   Resp: + shortness of breath with exertion not  at rest.  No excess mucus, no productive cough,  No non-productive cough,  No coughing up of blood.  No change in color of mucus.  No wheezing.  No chest wall deformity  Skin: no rash or lesions.  GU: no dysuria, change in color of urine, no urgency or frequency.  No flank pain, no hematuria   MS:  No joint pain or swelling.  No decreased range of motion.  No back pain.  Psych:   No change in mood or affect. No depression or anxiety.  No memory loss.   Vital Signs BP 142/88 mmHg  Pulse 59  Ht 5\' 10"  (1.778 m)  Wt 194 lb (87.998 kg)  BMI 27.84 kg/m2  SpO2 97%   Physical Exam:  General- No distress,  A&Ox3 ENT: No sinus tenderness, TM clear, pale nasal mucosa, no oral exudate,no post nasal drip, no LAN Cardiac: S1, S2, regular rate and rhythm, no murmur Chest: No wheeze/ rales/ dullness; no accessory muscle use, no nasal flaring, no sternal retractions Abd.: Soft Non-tender Ext: No clubbing cyanosis, edema Neuro:  normal strength Skin: No rashes, warm and dry Psych: Flat affect, appears depressed  Charles Leblanc, AGACNP-BC [facility] 10/10/2015 Assessment/Plan  OSA (obstructive sleep apnea) Pt. states she is not compliant with his CPAP every night. Plan: Continue on CPAP at bedtime. You appear to be benefiting from the treatment Goal is to wear for at least 4-6 hours each night for maximal clinical benefit. Continue to work on weight loss, as the link between excess weight  and sleep apnea is well established.  Do not drive if sleepy. Follow up with Dr. Christene Leblanc Aug. 14, 2017 as is already scheduled. Please contact office for sooner follow up if symptoms do not improve or worsen or seek emergency care    COPD GOLD 0  PFT's  10/10/2015  FEV1 2.75 (68 % ) ratio 80  p 4 % improvement from saba p no rx prior to study with DLCO  81/83c % corrects to 109 % for alv volume     Continued Dyspnea despite Symbicort Describes as a pain " Like being hit in the chest" not limited by sob > see cp    I reviewed the Fletcher curve with the patient that basically indicates  if you quit smoking when your best day FEV1 is still well preserved (as is clearly  the case here where his pfts are wnl)  it is highly unlikely you will progress to significant disease and informed the patient  That the CT scan does not make the diagnosis and is  c/w smoking hx but of no clinical consequence nor is it likely to ever be without additional exposure or superimposed asthma which unlikely to have as symptomatic at the pfts and no resp to symbicort   Chest pain CT w/cm 07/04/15 no  Central emboli/ minimal emphysema changes  LHC 09/28/15  Ost 2nd Diag lesion, 10% stenosed.  Mid LAD lesion, 20% stenosed.  The left ventricular systolic function is normal.  Normal LVEDP.   Classic subdiaphragmatic pain pattern suggests ibs:  Stereotypical, migratory with a very limited distribution of pain locations, daytime, not exacerbated by ex or coughing, worse in sitting position,  not present supine due to the dome effect of the diaphragm is  canceled in that position. Frequently these patients have had multiple negative cardiac and GI w/u's   Treatment consists of avoiding foods that cause gas (especially beans and raw vegetables like spinach and salads)  and citrucel 1 heaping tsp twice daily with a large glass of water.  Pain should improve w/in 2 weeks and if not then consider further GI work up.          Sandrea HughsMichael Wert, MD Pulmonary and Critical Care Medicine [facility] Cell [phone] After 5:30 PM or weekends, call [phone]

## 2015-10-10 NOTE — Progress Notes (Signed)
PFT done today. 

## 2015-10-27 ENCOUNTER — Other Ambulatory Visit: Payer: Self-pay | Admitting: Cardiovascular Disease

## 2015-10-31 ENCOUNTER — Telehealth: Payer: Self-pay | Admitting: Cardiovascular Disease

## 2015-10-31 NOTE — Telephone Encounter (Signed)
Spoke with patient's wife who states patient has stopped taking HCTZ and kdur and is wondering if he can stop the carvedilol due to feeling "drained."  I discussed the need for medication to control patient's hypertension and the effects uncontrolled hypertension could have on the patient's heart.  She states he will continue carvedilol for now and will follow-up as scheduled in July with Dr. Elease HashimotoNahser.  He would like to remain off the hctz and kdur.  I advised her to have him call back with questions or concerns.  She verbalized understanding and agreement.

## 2015-10-31 NOTE — Telephone Encounter (Signed)
New message    Pt c/o medication issue:  1. Name of Medication: Cardizem  2. How are you currently taking this medication (dosage and times per day)? The wife uncertain  3. Are you having a reaction (difficulty breathing--STAT)? no  4. What is your medication issue? Per wife she wants the pt switched back to another medications    Pt c/o medication issue:  1. Name of Medication: Metprolol  2. How are you currently taking this medication (dosage and times per day)? The wife uncertain of strength  3. Are you having a reaction (difficulty breathing--STAT)? no  4. What is your medication issue? Per wife she wants the pt back on this medications instead of the other Cardiazem

## 2015-12-04 ENCOUNTER — Other Ambulatory Visit: Payer: Self-pay

## 2015-12-13 ENCOUNTER — Other Ambulatory Visit: Payer: No Typology Code available for payment source

## 2015-12-13 ENCOUNTER — Ambulatory Visit (INDEPENDENT_AMBULATORY_CARE_PROVIDER_SITE_OTHER): Payer: No Typology Code available for payment source | Admitting: Cardiovascular Disease

## 2015-12-19 ENCOUNTER — Encounter: Payer: Self-pay | Admitting: Cardiovascular Disease

## 2016-01-07 ENCOUNTER — Ambulatory Visit: Payer: Self-pay | Admitting: Pulmonary Disease

## 2016-09-11 ENCOUNTER — Other Ambulatory Visit: Payer: Self-pay | Admitting: Physician Assistant

## 2016-09-11 ENCOUNTER — Ambulatory Visit
Admission: RE | Admit: 2016-09-11 | Discharge: 2016-09-11 | Disposition: A | Discharge status: Home / Self Care | Payer: BC Managed Care – PPO | Source: Ambulatory Visit | Attending: Physician Assistant | Admitting: Physician Assistant

## 2016-09-11 DIAGNOSIS — R05 Cough: Secondary | ICD-10-CM

## 2016-09-11 DIAGNOSIS — R059 Cough, unspecified: Secondary | ICD-10-CM

## 2016-10-30 ENCOUNTER — Ambulatory Visit: Payer: Self-pay | Admitting: Orthopedic Surgery

## 2016-11-04 ENCOUNTER — Ambulatory Visit: Payer: Self-pay | Admitting: Orthopedic Surgery

## 2016-11-04 ENCOUNTER — Encounter (HOSPITAL_COMMUNITY): Payer: Self-pay

## 2016-11-04 NOTE — H&P (Signed)
TOTAL KNEE ADMISSION H&P  Patient is being admitted for right total knee arthroplasty.  Subjective:  Chief Complaint:right knee pain.  HPI: Charles Leblanc, 56 y.o. male, has a history of pain and functional disability in the right knee due to arthritis and has failed non-surgical conservative treatments for greater than 12 weeks to includeNSAID's and/or analgesics, corticosteriod injections, flexibility and strengthening excercises, use of assistive devices, weight reduction as appropriate and activity modification.  Onset of symptoms was gradual, starting 5 years ago with rapidlly worsening course since that time. The patient noted no past surgery on the right knee(s).  Patient currently rates pain in the right knee(s) at 10 out of 10 with activity. Patient has night pain, worsening of pain with activity and weight bearing, pain that interferes with activities of daily living, pain with passive range of motion, crepitus and joint swelling.  Patient has evidence of subchondral cysts, subchondral sclerosis, periarticular osteophytes and joint space narrowing by imaging studies. There is no active infection.  Patient Active Problem List   Diagnosis Date Noted  . Precordial pain   . Chest pain 08/29/2015  . HTN (hypertension) 08/29/2015  . COPD GOLD 0  07/26/2015  . Exertional dyspnea 07/26/2015  . OSA (obstructive sleep apnea) 07/26/2015   Past Medical History:  Diagnosis Date  . Arthritis   . GERD (gastroesophageal reflux disease)   . Hyperlipidemia   . Hypertension     Past Surgical History:  Procedure Laterality Date  . CARDIAC CATHETERIZATION N/A 09/28/2015   Procedure: Left Heart Cath and Coronary Angiography;  Surgeon: Corky CraftsJayadeep S Varanasi, MD;  [facility];  Service: Cardiovascular;  Laterality: N/A;     (Not in a hospital admission) Allergies  Allergen Reactions  . Lipitor [Atorvastatin] Other (See Comments)    Migraines, Myalgia    Social History  Substance  Use Topics  . Smoking status: Former Smoker    Packs/day: 2.00    Years: 20.00    Types: Cigarettes    Quit date: 05/26/2014  . Smokeless tobacco: Never Used  . Alcohol use No    Family History  Problem Relation Age of Onset  . Heart failure Father   . Colon cancer Father   . Breast cancer Sister      Review of Systems  Constitutional: Positive for malaise/fatigue.  HENT: Negative.   Eyes: Negative.   Respiratory: Negative.   Cardiovascular: Negative.   Gastrointestinal: Negative.   Genitourinary: Negative.   Musculoskeletal: Positive for back pain and joint pain.  Skin: Negative.   Neurological: Negative.   Endo/Heme/Allergies: Negative.   Psychiatric/Behavioral: Negative.     Objective:  Physical Exam  Vitals reviewed. Constitutional: He is oriented to person, place, and time. He appears well-developed and well-nourished.  HENT:  Head: Normocephalic and atraumatic.  Eyes: Pupils are equal, round, and reactive to light.  Neck: Normal range of motion. Neck supple.  Cardiovascular: Normal rate and normal heart sounds.   Respiratory: Effort normal. No respiratory distress.  GI: Soft. He exhibits no distension.  Genitourinary:  Genitourinary Comments: deferred  Musculoskeletal:       Right knee: He exhibits decreased range of motion and swelling. Tenderness found. Medial joint line and lateral joint line tenderness noted.  Neurological: He is alert and oriented to person, place, and time. He has normal reflexes.  Skin: Skin is warm and dry.  Psychiatric: He has a normal mood and affect. His behavior is normal. Judgment and thought content normal.  Vital signs in last 24 hours: @VSRANGES @  Labs:   Estimated body mass index is 27.84 kg/m as calculated from the following:   Height as of 10/10/15: 5\' 10"  (1.778 m).   Weight as of 10/10/15: 88 kg (194 lb).   Imaging Review Plain radiographs demonstrate severe degenerative joint disease of the right knee(s). The  overall alignment issignificant varus. The bone quality appears to be adequate for age and reported activity level.  Assessment/Plan:  End stage arthritis, right knee   The patient history, physical examination, clinical judgment of the provider and imaging studies are consistent with end stage degenerative joint disease of the right knee(s) and total knee arthroplasty is deemed medically necessary. The treatment options including medical management, injection therapy arthroscopy and arthroplasty were discussed at length. The risks and benefits of total knee arthroplasty were presented and reviewed. The risks due to aseptic loosening, infection, stiffness, patella tracking problems, thromboembolic complications and other imponderables were discussed. The patient acknowledged the explanation, agreed to proceed with the plan and consent was signed. Patient is being admitted for inpatient treatment for surgery, pain control, PT, OT, prophylactic antibiotics, VTE prophylaxis, progressive ambulation and ADL's and discharge planning. The patient is planning to be discharged home with outpatient PT. Needs rolling walker.

## 2016-11-04 NOTE — Pre-Procedure Instructions (Signed)
Charles Leblanc  11/04/2016      [id number] [address] [place] [address] Phone: [phone] Fax: [phone]  [facility] - [place], [place] - [address] [place] [address] Phone: [phone] Fax: [phone]    Your procedure is scheduled on June 25  Report to [facility] Admitting at [facility]   Call this number if you have problems the morning of surgery:  [phone]   Remember:  Do not eat food or drink liquids after midnight.  Take these medicines the morning of surgery with A SIP OF WATER Tylenol if needed, Symbicort inhaler, carvedilol (Coreg), Proair inhaler if needed- bring all your inhalers with you on the day of surgery  Stop taking aspirin, BC's, Goody's, Herbal mediations, Fish Oil, Ibuprofen, Advil, Motrin, Aleve, Vitamins, Diclofenac (Voltaren)   Do not wear jewelry, make-up or nail polish.  Do not wear lotions, powders, or perfumes, or deoderant.  Do not shave 48 hours prior to surgery.  Men may shave face and neck.  Do not bring valuables to the hospital.  [facility] is not responsible for any belongings or valuables.  Contacts, dentures or bridgework may not be worn into surgery.  Leave your suitcase in the car.  After surgery it may be brought to your room.  For patients admitted to the hospital, discharge time will be determined by your treatment team.  Patients discharged the day of surgery will not be allowed to drive home.    Special instructions:  [place] - Preparing for Surgery  Before surgery, you can play an important role.  Because skin is not sterile, your skin needs to be as free of germs as possible.  You can reduce the number of germs on you skin by washing with CHG (chlorahexidine gluconate) soap before surgery.  CHG is an antiseptic cleaner which kills germs and bonds with the skin to continue killing germs even after  washing.  Please DO NOT use if you have an allergy to CHG or antibacterial soaps.  If your skin becomes reddened/irritated stop using the CHG and inform your nurse when you arrive at Short Stay.  Do not shave (including legs and underarms) for at least 48 hours prior to the first CHG shower.  You may shave your face.  Please follow these instructions carefully:   1.  Shower with CHG Soap the night before surgery and the  morning of Surgery.  2.  If you choose to wash your hair, wash your hair first as usual with your  normal shampoo.  3.  After you shampoo, rinse your hair and body thoroughly to remove the  Shampoo.  4.  Use CHG as you would any other liquid soap.  You can apply chg directly  to the skin and wash gently with scrungie or a clean washcloth.  5.  Apply the CHG Soap to your body ONLY FROM THE NECK DOWN.   Do not use on open wounds or open sores.  Avoid contact with your eyes,    ears, mouth and genitals (private parts).  Wash genitals (private parts) with your normal soap.  6.  Wash thoroughly, paying special attention to the area where your surgery will be performed.  7.  Thoroughly rinse your body with warm water from the neck down.  8.  DO NOT shower/wash with your normal soap after using and rinsing off the CHG Soap.  9.  Pat yourself dry with a clean towel.            10.  Wear clean pajamas.            11.  Place clean sheets on your bed the night of your first shower and do not        sleep with pets.  Day of Surgery  Do not apply any lotions/deoderants the morning of surgery.  Please wear clean clothes to the hospital/surgery center.    Please read over the following fact sheets that you were given. Pain Booklet, Coughing and Deep Breathing, Total Joint Packet, MRSA Information and Surgical Site Infection Prevention, incentive Spirometry

## 2016-11-05 ENCOUNTER — Encounter (HOSPITAL_COMMUNITY)
Admission: RE | Admit: 2016-11-05 | Discharge: 2016-11-05 | Disposition: A | Discharge status: Home / Self Care | Payer: BC Managed Care – PPO | Source: Ambulatory Visit | Attending: Orthopedic Surgery | Admitting: Orthopedic Surgery

## 2016-11-05 ENCOUNTER — Other Ambulatory Visit: Payer: Self-pay

## 2016-11-05 ENCOUNTER — Encounter (HOSPITAL_COMMUNITY): Payer: Self-pay

## 2016-11-05 DIAGNOSIS — Z87891 Personal history of nicotine dependence: Secondary | ICD-10-CM | POA: Diagnosis not present

## 2016-11-05 DIAGNOSIS — E785 Hyperlipidemia, unspecified: Secondary | ICD-10-CM | POA: Diagnosis not present

## 2016-11-05 DIAGNOSIS — K219 Gastro-esophageal reflux disease without esophagitis: Secondary | ICD-10-CM | POA: Insufficient documentation

## 2016-11-05 DIAGNOSIS — I2510 Atherosclerotic heart disease of native coronary artery without angina pectoris: Secondary | ICD-10-CM | POA: Insufficient documentation

## 2016-11-05 DIAGNOSIS — Z01818 Encounter for other preprocedural examination: Secondary | ICD-10-CM | POA: Insufficient documentation

## 2016-11-05 DIAGNOSIS — I10 Essential (primary) hypertension: Secondary | ICD-10-CM | POA: Diagnosis not present

## 2016-11-05 HISTORY — DX: Bronchitis, not specified as acute or chronic: J40

## 2016-11-05 HISTORY — DX: Sleep apnea, unspecified: G47.30

## 2016-11-05 HISTORY — DX: Alcohol dependence, uncomplicated: F10.20

## 2016-11-05 LAB — COMPREHENSIVE METABOLIC PANEL
ALBUMIN: 4.3 g/dL (ref 3.5–5.0)
ALK PHOS: 65 U/L (ref 38–126)
ALT: 20 U/L (ref 17–63)
ANION GAP: 7 (ref 5–15)
AST: 22 U/L (ref 15–41)
BUN: 11 mg/dL (ref 6–20)
CALCIUM: 9.6 mg/dL (ref 8.9–10.3)
CO2: 27 mmol/L (ref 22–32)
Chloride: 104 mmol/L (ref 101–111)
Creatinine, Ser: 1.23 mg/dL (ref 0.61–1.24)
GFR calc Af Amer: >60 mL/min (ref >60)
GFR calc non Af Amer: >60 mL/min (ref >60)
GLUCOSE: 99 mg/dL (ref 65–99)
POTASSIUM: 4.2 mmol/L (ref 3.5–5.1)
SODIUM: 138 mmol/L (ref 135–145)
TOTAL PROTEIN: 7.3 g/dL (ref 6.5–8.1)
Total Bilirubin: 0.7 mg/dL (ref 0.3–1.2)

## 2016-11-05 LAB — TYPE AND SCREEN
ABO/RH(D): O NEG
ANTIBODY SCREEN: NEGATIVE

## 2016-11-05 LAB — ABO/RH: ABO/RH(D): O NEG

## 2016-11-05 LAB — CBC
HEMATOCRIT: 43.4 % (ref 39.0–52.0)
HEMOGLOBIN: 14.3 g/dL (ref 13.0–17.0)
MCH: 29.9 pg (ref 26.0–34.0)
MCHC: 32.9 g/dL (ref 30.0–36.0)
MCV: 90.8 fL (ref 78.0–100.0)
Platelets: 240 10*3/uL (ref 150–400)
RBC: 4.78 MIL/uL (ref 4.22–5.81)
RDW: 12.5 % (ref 11.5–15.5)
WBC: 7.7 10*3/uL (ref 4.0–10.5)

## 2016-11-05 LAB — SURGICAL PCR SCREEN
MRSA, PCR: NEGATIVE
Staphylococcus aureus: NEGATIVE

## 2016-11-05 NOTE — Progress Notes (Signed)
PCP - Altamease OilerNoelle redmon Cardiologist - Nasher  Chest x-ray - not needed EKG - 11/05/16 Stress Test - 09/14/15 ECHO - 09/14/15 Cardiac Cath - 09/28/15  Sleep Study - 3 years ago? Requesting from [facility] CPAP - has one but does not wear  Sending to anestehsia for review of requested sleep study and cardiac history     Patient denies shortness of breath, fever, cough and chest pain at PAT appointment   Patient verbalized understanding of instructions that were given to them at the PAT appointment. Patient was also instructed that they will need to review over the PAT instructions again at home before surgery.

## 2016-11-06 NOTE — Progress Notes (Signed)
Anesthesia Chart Review:  Pt is a 56 year old male scheduled for computer assisted R total knee arthroplasty on 11/17/2016 with Samson FredericBrian Swinteck, M.D.  - PCP is Milus HeightNoelle Redmon, [place]  PMH includes: CAD (mild by 2017 cath), HTN, hyperlipidemia, OSA, remote history alcohol abuse, GERD. Former smoker. BMI 25.  Medications include: Symbicort, carvedilol, lisinopril, albuterol  Preoperative labs reviewed.  CXR 09/11/16: Suspect bronchitis. No focal pneumonia.  EKG 11/05/16: Sinus bradycardia (49 bpm). ST elevation, consider early repolarization  Cardiac cath 09/28/15:   Ost 2nd Diag lesion, 10% stenosed.  Mid LAD lesion, 20% stenosed.  The left ventricular systolic function is normal.  Normal LVEDP.  The patient had some right radial vasospasm requiring intra-arterial nitroglycerin. - Minimal coronary artery plaque noted. Continue aggressive preventative therapy. Investigate noncardiac etiologies of his symptoms.  Nuclear stress test 09/14/15:   Nuclear stress EF: 54%.  There was no ST segment deviation noted during stress.  This is a low risk study.  The left ventricular ejection fraction is mildly decreased (45-54%). 1. EF 54%.  Visually appears normal with normal wall motion.  2. Small, mild basal to mid inferior perfusion defect. The defect actually looks worse at rest.  No ischemia.  Suspect most likely diaphragmatic attenuation given normal wall motion.   Echo 09/14/15:  - Left ventricle: The cavity size was normal. There was mild focal basal hypertrophy of the septum. Systolic function was normal. The estimated ejection fraction was in the range of 55% to 60%. Wall motion was normal; there were no regional wall motion abnormalities. Left ventricular diastolic function parameters were normal.  If no changes, I anticipate pt can proceed with surgery as scheduled.   Rica Mastngela Christiaan Strebeck, FNP-BC [facility] Phone: [phone] 11/06/2016 1:30 PM

## 2016-11-14 MED ORDER — TRANEXAMIC ACID 1000 MG/10ML IV SOLN
1000.0000 mg | INTRAVENOUS | Status: AC
  Administered 2016-11-17: 1000 mg via INTRAVENOUS
  Filled 2016-11-14: qty 10

## 2016-11-14 MED ORDER — ACETAMINOPHEN 10 MG/ML IV SOLN
1000.0000 mg | INTRAVENOUS | Status: AC
  Administered 2016-11-17: 1000 mg via INTRAVENOUS
  Filled 2016-11-14: qty 100

## 2016-11-14 MED ORDER — CEFAZOLIN SODIUM-DEXTROSE 2-4 GM/100ML-% IV SOLN
2.0000 g | INTRAVENOUS | Status: AC
  Administered 2016-11-17: 2 g via INTRAVENOUS
  Filled 2016-11-14: qty 100

## 2016-11-14 MED ORDER — SODIUM CHLORIDE 0.9 % IV SOLN
INTRAVENOUS | Status: DC
  Administered 2016-11-17: 21:00:00 via INTRAVENOUS

## 2016-11-14 NOTE — Progress Notes (Signed)
Spoke with Tresa EndoKelly at [facility] states she has already faxed over the sleep study, but she will send it now.

## 2016-11-17 ENCOUNTER — Inpatient Hospital Stay (HOSPITAL_COMMUNITY): Payer: BC Managed Care – PPO

## 2016-11-17 ENCOUNTER — Encounter (HOSPITAL_COMMUNITY)
Admission: RE | Disposition: A | Discharge status: Home / Self Care | Payer: Self-pay | Source: Ambulatory Visit | Attending: Orthopedic Surgery

## 2016-11-17 ENCOUNTER — Encounter (HOSPITAL_COMMUNITY): Payer: Self-pay | Admitting: *Deleted

## 2016-11-17 ENCOUNTER — Inpatient Hospital Stay (HOSPITAL_COMMUNITY)
Admission: RE | Admit: 2016-11-17 | Discharge: 2016-11-18 | DRG: 470 | Disposition: A | Discharge status: Home / Self Care | Payer: BC Managed Care – PPO | Source: Ambulatory Visit | Attending: Orthopedic Surgery | Admitting: Orthopedic Surgery

## 2016-11-17 ENCOUNTER — Inpatient Hospital Stay (HOSPITAL_COMMUNITY): Payer: BC Managed Care – PPO | Admitting: Emergency Medicine

## 2016-11-17 ENCOUNTER — Inpatient Hospital Stay (HOSPITAL_COMMUNITY): Payer: BC Managed Care – PPO | Admitting: Anesthesiology

## 2016-11-17 DIAGNOSIS — K219 Gastro-esophageal reflux disease without esophagitis: Secondary | ICD-10-CM | POA: Diagnosis present

## 2016-11-17 DIAGNOSIS — Z888 Allergy status to other drugs, medicaments and biological substances status: Secondary | ICD-10-CM

## 2016-11-17 DIAGNOSIS — Z87891 Personal history of nicotine dependence: Secondary | ICD-10-CM | POA: Diagnosis not present

## 2016-11-17 DIAGNOSIS — J449 Chronic obstructive pulmonary disease, unspecified: Secondary | ICD-10-CM | POA: Diagnosis present

## 2016-11-17 DIAGNOSIS — E785 Hyperlipidemia, unspecified: Secondary | ICD-10-CM | POA: Diagnosis present

## 2016-11-17 DIAGNOSIS — G4733 Obstructive sleep apnea (adult) (pediatric): Secondary | ICD-10-CM | POA: Diagnosis present

## 2016-11-17 DIAGNOSIS — I10 Essential (primary) hypertension: Secondary | ICD-10-CM | POA: Diagnosis present

## 2016-11-17 DIAGNOSIS — M25561 Pain in right knee: Secondary | ICD-10-CM | POA: Diagnosis present

## 2016-11-17 DIAGNOSIS — Z09 Encounter for follow-up examination after completed treatment for conditions other than malignant neoplasm: Secondary | ICD-10-CM

## 2016-11-17 DIAGNOSIS — M1711 Unilateral primary osteoarthritis, right knee: Secondary | ICD-10-CM | POA: Diagnosis present

## 2016-11-17 DIAGNOSIS — Z8249 Family history of ischemic heart disease and other diseases of the circulatory system: Secondary | ICD-10-CM | POA: Diagnosis not present

## 2016-11-17 HISTORY — PX: KNEE ARTHROPLASTY: SHX992

## 2016-11-17 SURGERY — ARTHROPLASTY, KNEE, TOTAL, USING IMAGELESS COMPUTER-ASSISTED NAVIGATION
Anesthesia: Regional | Laterality: Right

## 2016-11-17 MED ORDER — ALUM & MAG HYDROXIDE-SIMETH 200-200-20 MG/5ML PO SUSP: 30.0000 mL | ORAL | Status: DC | PRN

## 2016-11-17 MED ORDER — ACETAMINOPHEN 650 MG RE SUPP: 650.0000 mg | Freq: Four times a day (QID) | RECTAL | Status: DC | PRN

## 2016-11-17 MED ORDER — LACTATED RINGERS IV SOLN
INTRAVENOUS | Status: DC
  Administered 2016-11-17: 13:00:00 via INTRAVENOUS

## 2016-11-17 MED ORDER — OXYCODONE HCL 5 MG PO TABS: 5.0000 mg | ORAL_TABLET | Freq: Once | ORAL | Status: DC | PRN

## 2016-11-17 MED ORDER — CHLORHEXIDINE GLUCONATE 4 % EX LIQD: 60.0000 mL | Freq: Once | CUTANEOUS | Status: DC

## 2016-11-17 MED ORDER — HYDROCODONE-ACETAMINOPHEN 5-325 MG PO TABS
1.0000 | ORAL_TABLET | ORAL | Status: DC | PRN
  Administered 2016-11-17 – 2016-11-18 (×2): 2 via ORAL
  Filled 2016-11-17 (×2): qty 2

## 2016-11-17 MED ORDER — 0.9 % SODIUM CHLORIDE (POUR BTL) OPTIME
TOPICAL | Status: DC | PRN
  Administered 2016-11-17: 1000 mL

## 2016-11-17 MED ORDER — PHENYLEPHRINE HCL 10 MG/ML IJ SOLN
INTRAVENOUS | Status: DC | PRN
  Administered 2016-11-17: 25 ug/min via INTRAVENOUS

## 2016-11-17 MED ORDER — ONDANSETRON HCL 4 MG PO TABS: 4.0000 mg | ORAL_TABLET | Freq: Four times a day (QID) | ORAL | Status: DC | PRN

## 2016-11-17 MED ORDER — POLYETHYLENE GLYCOL 3350 17 G PO PACK: 17.0000 g | PACK | Freq: Every day | ORAL | Status: DC | PRN

## 2016-11-17 MED ORDER — HYDROMORPHONE HCL 1 MG/ML IJ SOLN
0.2500 mg | INTRAMUSCULAR | Status: DC | PRN
  Administered 2016-11-17 (×4): 0.5 mg via INTRAVENOUS

## 2016-11-17 MED ORDER — LISINOPRIL 10 MG PO TABS: 10.0000 mg | ORAL_TABLET | Freq: Every day | ORAL | Status: DC

## 2016-11-17 MED ORDER — ASPIRIN 81 MG PO CHEW
81.0000 mg | CHEWABLE_TABLET | Freq: Two times a day (BID) | ORAL | Status: DC
  Administered 2016-11-17 – 2016-11-18 (×2): 81 mg via ORAL
  Filled 2016-11-17 (×2): qty 1

## 2016-11-17 MED ORDER — KETOROLAC TROMETHAMINE 30 MG/ML IJ SOLN
INTRAMUSCULAR | Status: DC | PRN
  Administered 2016-11-17: 30 mg

## 2016-11-17 MED ORDER — ROPIVACAINE HCL 5 MG/ML IJ SOLN
INTRAMUSCULAR | Status: DC | PRN
  Administered 2016-11-17: 30 mL via PERINEURAL

## 2016-11-17 MED ORDER — METOCLOPRAMIDE HCL 5 MG PO TABS: 5.0000 mg | ORAL_TABLET | Freq: Three times a day (TID) | ORAL | Status: DC | PRN

## 2016-11-17 MED ORDER — FENTANYL CITRATE (PF) 250 MCG/5ML IJ SOLN
INTRAMUSCULAR | Status: AC
  Filled 2016-11-17: qty 5

## 2016-11-17 MED ORDER — STERILE WATER FOR IRRIGATION IR SOLN
Status: DC | PRN
  Administered 2016-11-17: 1000 mL

## 2016-11-17 MED ORDER — CEFAZOLIN SODIUM-DEXTROSE 2-4 GM/100ML-% IV SOLN
2.0000 g | Freq: Four times a day (QID) | INTRAVENOUS | Status: AC
  Administered 2016-11-18 (×2): 2 g via INTRAVENOUS
  Filled 2016-11-17 (×2): qty 100

## 2016-11-17 MED ORDER — FENTANYL CITRATE (PF) 100 MCG/2ML IJ SOLN
INTRAMUSCULAR | Status: DC | PRN
  Administered 2016-11-17 (×2): 50 ug via INTRAVENOUS

## 2016-11-17 MED ORDER — DEXAMETHASONE SODIUM PHOSPHATE 10 MG/ML IJ SOLN
10.0000 mg | Freq: Once | INTRAMUSCULAR | Status: AC
  Administered 2016-11-18: 10 mg via INTRAVENOUS
  Filled 2016-11-17: qty 1

## 2016-11-17 MED ORDER — BUPIVACAINE-EPINEPHRINE (PF) 0.5% -1:200000 IJ SOLN
INTRAMUSCULAR | Status: DC | PRN
  Administered 2016-11-17: 30 mL via PERINEURAL

## 2016-11-17 MED ORDER — OXYCODONE HCL 5 MG/5ML PO SOLN: 5.0000 mg | Freq: Once | ORAL | Status: DC | PRN

## 2016-11-17 MED ORDER — SODIUM CHLORIDE 0.9 % IR SOLN
Status: DC | PRN
  Administered 2016-11-17: 3000 mL

## 2016-11-17 MED ORDER — MOMETASONE FURO-FORMOTEROL FUM 200-5 MCG/ACT IN AERO
2.0000 | INHALATION_SPRAY | Freq: Two times a day (BID) | RESPIRATORY_TRACT | Status: DC
  Administered 2016-11-17: 2 via RESPIRATORY_TRACT
  Filled 2016-11-17: qty 8.8

## 2016-11-17 MED ORDER — ONDANSETRON HCL 4 MG/2ML IJ SOLN: 4.0000 mg | Freq: Four times a day (QID) | INTRAMUSCULAR | Status: DC | PRN

## 2016-11-17 MED ORDER — HYDROMORPHONE HCL 1 MG/ML IJ SOLN
INTRAMUSCULAR | Status: AC
  Filled 2016-11-17: qty 1

## 2016-11-17 MED ORDER — PROPOFOL 10 MG/ML IV BOLUS
INTRAVENOUS | Status: DC | PRN
  Administered 2016-11-17: 20 mg via INTRAVENOUS

## 2016-11-17 MED ORDER — SODIUM CHLORIDE 0.9 % IV SOLN
INTRAVENOUS | Status: DC
  Administered 2016-11-17: 22:00:00 via INTRAVENOUS
  Administered 2016-11-18: 150 mL/h via INTRAVENOUS

## 2016-11-17 MED ORDER — SODIUM CHLORIDE 0.9 % IJ SOLN
INTRAMUSCULAR | Status: DC | PRN
  Administered 2016-11-17 (×2): 10 mL

## 2016-11-17 MED ORDER — LIDOCAINE 2% (20 MG/ML) 5 ML SYRINGE
INTRAMUSCULAR | Status: DC | PRN
  Administered 2016-11-17: 20 mg via INTRAVENOUS

## 2016-11-17 MED ORDER — DEXTROSE 5 % IV SOLN
500.0000 mg | Freq: Four times a day (QID) | INTRAVENOUS | Status: DC | PRN
  Filled 2016-11-17: qty 5

## 2016-11-17 MED ORDER — MENTHOL 3 MG MT LOZG: 1.0000 | LOZENGE | OROMUCOSAL | Status: DC | PRN

## 2016-11-17 MED ORDER — MIDAZOLAM HCL 2 MG/2ML IJ SOLN
2.0000 mg | Freq: Once | INTRAMUSCULAR | Status: AC
  Administered 2016-11-17: 2 mg via INTRAVENOUS
  Filled 2016-11-17: qty 2

## 2016-11-17 MED ORDER — LIDOCAINE 2% (20 MG/ML) 5 ML SYRINGE
INTRAMUSCULAR | Status: AC
  Filled 2016-11-17: qty 5

## 2016-11-17 MED ORDER — BUPIVACAINE HCL (PF) 0.75 % IJ SOLN
INTRAMUSCULAR | Status: DC | PRN
  Administered 2016-11-17: 2 mL via INTRATHECAL

## 2016-11-17 MED ORDER — OXYCODONE HCL ER 10 MG PO T12A
10.0000 mg | EXTENDED_RELEASE_TABLET | Freq: Two times a day (BID) | ORAL | Status: DC
  Administered 2016-11-17 – 2016-11-18 (×2): 10 mg via ORAL
  Filled 2016-11-17 (×2): qty 1

## 2016-11-17 MED ORDER — MIDAZOLAM HCL 2 MG/2ML IJ SOLN
INTRAMUSCULAR | Status: AC
  Administered 2016-11-17: 2 mg via INTRAVENOUS
  Filled 2016-11-17: qty 2

## 2016-11-17 MED ORDER — FENTANYL CITRATE (PF) 100 MCG/2ML IJ SOLN
INTRAMUSCULAR | Status: AC
  Filled 2016-11-17: qty 2

## 2016-11-17 MED ORDER — HYDROMORPHONE HCL 1 MG/ML IJ SOLN
0.5000 mg | INTRAMUSCULAR | Status: DC | PRN
  Administered 2016-11-18: 1 mg via INTRAVENOUS
  Filled 2016-11-17: qty 1

## 2016-11-17 MED ORDER — PROMETHAZINE HCL 25 MG/ML IJ SOLN: 6.2500 mg | INTRAMUSCULAR | Status: DC | PRN

## 2016-11-17 MED ORDER — SENNA 8.6 MG PO TABS
2.0000 | ORAL_TABLET | Freq: Every day | ORAL | Status: DC
  Administered 2016-11-17: 17.2 mg via ORAL
  Filled 2016-11-17: qty 2

## 2016-11-17 MED ORDER — EPHEDRINE SULFATE-NACL 50-0.9 MG/10ML-% IV SOSY
PREFILLED_SYRINGE | INTRAVENOUS | Status: DC | PRN
  Administered 2016-11-17 (×2): 5 mg via INTRAVENOUS

## 2016-11-17 MED ORDER — PHENOL 1.4 % MT LIQD: 1.0000 | OROMUCOSAL | Status: DC | PRN

## 2016-11-17 MED ORDER — PROPOFOL 500 MG/50ML IV EMUL
INTRAVENOUS | Status: DC | PRN
  Administered 2016-11-17: 50 ug/kg/min via INTRAVENOUS

## 2016-11-17 MED ORDER — TRANEXAMIC ACID 1000 MG/10ML IV SOLN
1000.0000 mg | Freq: Once | INTRAVENOUS | Status: AC
  Administered 2016-11-18: 1000 mg via INTRAVENOUS
  Filled 2016-11-17: qty 10

## 2016-11-17 MED ORDER — ACETAMINOPHEN 325 MG PO TABS: 650.0000 mg | ORAL_TABLET | Freq: Four times a day (QID) | ORAL | Status: DC | PRN

## 2016-11-17 MED ORDER — METHOCARBAMOL 500 MG PO TABS
500.0000 mg | ORAL_TABLET | Freq: Four times a day (QID) | ORAL | Status: DC | PRN
  Administered 2016-11-17: 500 mg via ORAL
  Filled 2016-11-17: qty 1

## 2016-11-17 MED ORDER — CARVEDILOL 12.5 MG PO TABS
12.5000 mg | ORAL_TABLET | Freq: Two times a day (BID) | ORAL | Status: DC
  Administered 2016-11-17 – 2016-11-18 (×2): 12.5 mg via ORAL
  Filled 2016-11-17 (×2): qty 1

## 2016-11-17 MED ORDER — METOCLOPRAMIDE HCL 5 MG/ML IJ SOLN: 5.0000 mg | Freq: Three times a day (TID) | INTRAMUSCULAR | Status: DC | PRN

## 2016-11-17 MED ORDER — MIDAZOLAM HCL 2 MG/2ML IJ SOLN
INTRAMUSCULAR | Status: AC
Start: 2016-11-17 — End: 2016-11-17
  Filled 2016-11-17: qty 2

## 2016-11-17 MED ORDER — KETOROLAC TROMETHAMINE 30 MG/ML IJ SOLN
INTRAMUSCULAR | Status: AC
  Filled 2016-11-17: qty 1

## 2016-11-17 MED ORDER — KETOROLAC TROMETHAMINE 15 MG/ML IJ SOLN
15.0000 mg | Freq: Four times a day (QID) | INTRAMUSCULAR | Status: DC
  Administered 2016-11-18 (×3): 15 mg via INTRAVENOUS
  Filled 2016-11-17 (×3): qty 1

## 2016-11-17 MED ORDER — DOCUSATE SODIUM 100 MG PO CAPS
100.0000 mg | ORAL_CAPSULE | Freq: Two times a day (BID) | ORAL | Status: DC
  Administered 2016-11-17 – 2016-11-18 (×2): 100 mg via ORAL
  Filled 2016-11-17 (×2): qty 1

## 2016-11-17 MED ORDER — BUPIVACAINE-EPINEPHRINE (PF) 0.5% -1:200000 IJ SOLN
INTRAMUSCULAR | Status: AC
  Filled 2016-11-17: qty 30

## 2016-11-17 MED ORDER — DIPHENHYDRAMINE HCL 12.5 MG/5ML PO ELIX: 12.5000 mg | ORAL_SOLUTION | ORAL | Status: DC | PRN

## 2016-11-17 MED ORDER — EPHEDRINE 5 MG/ML INJ
INTRAVENOUS | Status: AC
  Filled 2016-11-17: qty 10

## 2016-11-17 MED ORDER — ALBUTEROL SULFATE (2.5 MG/3ML) 0.083% IN NEBU: 3.0000 mL | INHALATION_SOLUTION | RESPIRATORY_TRACT | Status: DC | PRN

## 2016-11-17 MED ORDER — POVIDONE-IODINE 10 % EX SWAB
2.0000 "application " | Freq: Once | CUTANEOUS | Status: AC
  Administered 2016-11-17: 2 via TOPICAL

## 2016-11-17 SURGICAL SUPPLY — 54 items
ALCOHOL ISOPROPYL (RUBBING) (MISCELLANEOUS) ×3 IMPLANT
BANDAGE ACE 6X5 VEL STRL LF (GAUZE/BANDAGES/DRESSINGS) ×3 IMPLANT
BATTERY INSTRU NAVIGATION (MISCELLANEOUS) ×9 IMPLANT
BLADE SAW RECIP 87.9 MT (BLADE) ×3 IMPLANT
BNDG ELASTIC 6X10 VLCR STRL LF (GAUZE/BANDAGES/DRESSINGS) ×3 IMPLANT
CAPT KNEE TRIATH TK-4 ×3 IMPLANT
CHLORAPREP W/TINT 26ML (MISCELLANEOUS) ×3 IMPLANT
CUFF TOURNIQUET SINGLE 34IN LL (TOURNIQUET CUFF) ×3 IMPLANT
DERMABOND ADVANCED (GAUZE/BANDAGES/DRESSINGS) ×2
DERMABOND ADVANCED .7 DNX12 (GAUZE/BANDAGES/DRESSINGS) ×1 IMPLANT
DRAIN HEMOVAC 7FR (DRAIN) IMPLANT
DRAPE EXTREMITY T 121X128X90 (DRAPE) ×3 IMPLANT
DRAPE SHEET LG 3/4 BI-LAMINATE (DRAPES) ×6 IMPLANT
DRAPE U-SHAPE 47X51 STRL (DRAPES) ×3 IMPLANT
DRAPE UNIVERSAL PACK (DRAPES) ×3 IMPLANT
DRSG AQUACEL AG ADV 3.5X14 (GAUZE/BANDAGES/DRESSINGS) ×3 IMPLANT
DRSG TEGADERM 2-3/8X2-3/4 SM (GAUZE/BANDAGES/DRESSINGS) IMPLANT
ELECT REM PT RETURN 9FT ADLT (ELECTROSURGICAL) ×3
ELECTRODE REM PT RTRN 9FT ADLT (ELECTROSURGICAL) ×1 IMPLANT
EVACUATOR 1/8 PVC DRAIN (DRAIN) IMPLANT
GLOVE BIO SURGEON STRL SZ8.5 (GLOVE) ×9 IMPLANT
GLOVE BIOGEL PI IND STRL 6.5 (GLOVE) ×2 IMPLANT
GLOVE BIOGEL PI IND STRL 8.5 (GLOVE) ×2 IMPLANT
GLOVE BIOGEL PI INDICATOR 6.5 (GLOVE) ×4
GLOVE BIOGEL PI INDICATOR 8.5 (GLOVE) ×4
GLOVE SURG SS PI 6.5 STRL IVOR (GLOVE) ×6 IMPLANT
GLOVE SURG SS PI 8.5 STRL IVOR (GLOVE) ×4
GLOVE SURG SS PI 8.5 STRL STRW (GLOVE) ×2 IMPLANT
GOWN STRL REUS W/ TWL LRG LVL3 (GOWN DISPOSABLE) ×2 IMPLANT
GOWN STRL REUS W/TWL 2XL LVL3 (GOWN DISPOSABLE) ×6 IMPLANT
GOWN STRL REUS W/TWL LRG LVL3 (GOWN DISPOSABLE) ×4
HANDPIECE INTERPULSE COAX TIP (DISPOSABLE) ×2
KIT BASIN OR (CUSTOM PROCEDURE TRAY) ×3 IMPLANT
MANIFOLD NEPTUNE II (INSTRUMENTS) ×3 IMPLANT
NEEDLE SPNL 18GX3.5 QUINCKE PK (NEEDLE) ×6 IMPLANT
PACK TOTAL JOINT (CUSTOM PROCEDURE TRAY) ×3 IMPLANT
PACK TOTAL KNEE CUSTOM (KITS) ×3 IMPLANT
PADDING CAST COTTON 6X4 STRL (CAST SUPPLIES) ×3 IMPLANT
SAW OSC TIP CART 19.5X105X1.3 (SAW) ×3 IMPLANT
SEALER BIPOLAR AQUA 6.0 (INSTRUMENTS) ×3 IMPLANT
SET HNDPC FAN SPRY TIP SCT (DISPOSABLE) ×1 IMPLANT
SET PAD KNEE POSITIONER (MISCELLANEOUS) ×3 IMPLANT
SUT MNCRL AB 3-0 PS2 27 (SUTURE) ×3 IMPLANT
SUT MON AB 2-0 CT1 36 (SUTURE) ×9 IMPLANT
SUT VIC AB 0 CTX 36 (SUTURE) ×2
SUT VIC AB 0 CTX36XBRD ANTBCTR (SUTURE) ×1 IMPLANT
SUT VIC AB 1 CTX 27 (SUTURE) ×9 IMPLANT
SUT VIC AB 2-0 CT1 27 (SUTURE) ×2
SUT VIC AB 2-0 CT1 TAPERPNT 27 (SUTURE) ×1 IMPLANT
SUT VLOC 180 0 24IN GS25 (SUTURE) ×3 IMPLANT
SYR 50ML LL SCALE MARK (SYRINGE) ×6 IMPLANT
TOWER CARTRIDGE SMART MIX (DISPOSABLE) IMPLANT
TRAY CATH 16FR W/PLASTIC CATH (SET/KITS/TRAYS/PACK) ×3 IMPLANT
WRAP KNEE MAXI GEL POST OP (GAUZE/BANDAGES/DRESSINGS) ×3 IMPLANT

## 2016-11-17 NOTE — Anesthesia Procedure Notes (Signed)
Anesthesia Regional Block: Adductor canal block   Pre-Anesthetic Checklist: ,, timeout performed, Correct Patient, Correct Site, Correct Laterality, Correct Procedure,, site marked, risks and benefits discussed, Surgical consent,  Pre-op evaluation,  At surgeon's request and post-op pain management  Laterality: Right  Prep: chloraprep       Needles:  Injection technique: Single-shot  Needle Type: Echogenic Stimulator Needle     Needle Length: 9cm  Needle Gauge: 21     Additional Needles:   Procedures: ultrasound guided,,,,,,,,  Narrative:  Start time: 11/17/2016 1:25 PM End time: 11/17/2016 1:35 PM Injection made incrementally with aspirations every 5 mL.  Performed by: Personally  Anesthesiologist: Karna ChristmasELLENDER, Kurtiss Wence P  Additional Notes: Functioning IV was confirmed and monitors were applied.  A 90mm 21ga Arrow echogenic stimulator needle was used. Sterile prep and drape,hand hygiene and sterile gloves were used.  Negative aspiration and negative test dose prior to incremental administration of local anesthetic. The patient tolerated the procedure well.

## 2016-11-17 NOTE — Transfer of Care (Signed)
Immediate Anesthesia Transfer of Care Note  Patient: Charles Leblanc  Procedure(s) Performed: Procedure(s): COMPUTER ASSISTED TOTAL KNEE ARTHROPLASTY (Right)  Patient Location: PACU  Anesthesia Type:MAC combined with regional for post-op pain  Level of Consciousness: awake, alert , oriented and patient cooperative  Airway & Oxygen Therapy: Patient Spontanous Breathing  Post-op Assessment: Report given to RN and Post -op Vital signs reviewed and stable  Post vital signs: Reviewed and stable  Last Vitals:  Vitals:   11/17/16 1220  BP: (!) 156/88  Pulse: 73  Resp: 18  Temp: 36.8 C    Last Pain:  Vitals:   11/17/16 1230  TempSrc:   PainSc: 10-Worst pain ever      Patients Stated Pain Goal: 3 (81/82/99 3716)  Complications: No apparent anesthesia complications

## 2016-11-17 NOTE — H&P (View-Only) (Signed)
TOTAL KNEE ADMISSION H&P  Patient is being admitted for right total knee arthroplasty.  Subjective:  Chief Complaint:right knee pain.  HPI: Charles Leblanc, 56 y.o. male, has a history of pain and functional disability in the right knee due to arthritis and has failed non-surgical conservative treatments for greater than 12 weeks to includeNSAID's and/or analgesics, corticosteriod injections, flexibility and strengthening excercises, use of assistive devices, weight reduction as appropriate and activity modification.  Onset of symptoms was gradual, starting 5 years ago with rapidlly worsening course since that time. The patient noted no past surgery on the right knee(s).  Patient currently rates pain in the right knee(s) at 10 out of 10 with activity. Patient has night pain, worsening of pain with activity and weight bearing, pain that interferes with activities of daily living, pain with passive range of motion, crepitus and joint swelling.  Patient has evidence of subchondral cysts, subchondral sclerosis, periarticular osteophytes and joint space narrowing by imaging studies. There is no active infection.  Patient Active Problem List   Diagnosis Date Noted  . Precordial pain   . Chest pain 08/29/2015  . HTN (hypertension) 08/29/2015  . COPD GOLD 0  07/26/2015  . Exertional dyspnea 07/26/2015  . OSA (obstructive sleep apnea) 07/26/2015   Past Medical History:  Diagnosis Date  . Arthritis   . GERD (gastroesophageal reflux disease)   . Hyperlipidemia   . Hypertension     Past Surgical History:  Procedure Laterality Date  . CARDIAC CATHETERIZATION N/A 09/28/2015   Procedure: Left Heart Cath and Coronary Angiography;  Surgeon: Corky CraftsJayadeep S Varanasi, MD;  [facility];  Service: Cardiovascular;  Laterality: N/A;     (Not in a hospital admission) Allergies  Allergen Reactions  . Lipitor [Atorvastatin] Other (See Comments)    Migraines, Myalgia    Social History  Substance  Use Topics  . Smoking status: Former Smoker    Packs/day: 2.00    Years: 20.00    Types: Cigarettes    Quit date: 05/26/2014  . Smokeless tobacco: Never Used  . Alcohol use No    Family History  Problem Relation Age of Onset  . Heart failure Father   . Colon cancer Father   . Breast cancer Sister      Review of Systems  Constitutional: Positive for malaise/fatigue.  HENT: Negative.   Eyes: Negative.   Respiratory: Negative.   Cardiovascular: Negative.   Gastrointestinal: Negative.   Genitourinary: Negative.   Musculoskeletal: Positive for back pain and joint pain.  Skin: Negative.   Neurological: Negative.   Endo/Heme/Allergies: Negative.   Psychiatric/Behavioral: Negative.     Objective:  Physical Exam  Vitals reviewed. Constitutional: He is oriented to person, place, and time. He appears well-developed and well-nourished.  HENT:  Head: Normocephalic and atraumatic.  Eyes: Pupils are equal, round, and reactive to light.  Neck: Normal range of motion. Neck supple.  Cardiovascular: Normal rate and normal heart sounds.   Respiratory: Effort normal. No respiratory distress.  GI: Soft. He exhibits no distension.  Genitourinary:  Genitourinary Comments: deferred  Musculoskeletal:       Right knee: He exhibits decreased range of motion and swelling. Tenderness found. Medial joint line and lateral joint line tenderness noted.  Neurological: He is alert and oriented to person, place, and time. He has normal reflexes.  Skin: Skin is warm and dry.  Psychiatric: He has a normal mood and affect. His behavior is normal. Judgment and thought content normal.  Vital signs in last 24 hours: @VSRANGES @  Labs:   Estimated body mass index is 27.84 kg/m as calculated from the following:   Height as of 10/10/15: 5\' 10"  (1.778 m).   Weight as of 10/10/15: 88 kg (194 lb).   Imaging Review Plain radiographs demonstrate severe degenerative joint disease of the right knee(s). The  overall alignment issignificant varus. The bone quality appears to be adequate for age and reported activity level.  Assessment/Plan:  End stage arthritis, right knee   The patient history, physical examination, clinical judgment of the provider and imaging studies are consistent with end stage degenerative joint disease of the right knee(s) and total knee arthroplasty is deemed medically necessary. The treatment options including medical management, injection therapy arthroscopy and arthroplasty were discussed at length. The risks and benefits of total knee arthroplasty were presented and reviewed. The risks due to aseptic loosening, infection, stiffness, patella tracking problems, thromboembolic complications and other imponderables were discussed. The patient acknowledged the explanation, agreed to proceed with the plan and consent was signed. Patient is being admitted for inpatient treatment for surgery, pain control, PT, OT, prophylactic antibiotics, VTE prophylaxis, progressive ambulation and ADL's and discharge planning. The patient is planning to be discharged home with outpatient PT. Needs rolling walker.

## 2016-11-17 NOTE — Progress Notes (Signed)
Patient refuses CPAP for the night. RT will continue to monitor.  

## 2016-11-17 NOTE — Interval H&P Note (Signed)
History and Physical Interval Note:  11/17/2016 2:41 PM  Charles Leblanc  has presented today for surgery, with the diagnosis of Degenerative joint disease right knee  The various methods of treatment have been discussed with the patient and family. After consideration of risks, benefits and other options for treatment, the patient has consented to  Procedure(s): COMPUTER ASSISTED TOTAL KNEE ARTHROPLASTY (Right) as a surgical intervention .  The patient's history has been reviewed, patient examined, no change in status, stable for surgery.  I have reviewed the patient's chart and labs.  Questions were answered to the patient's satisfaction.     Gisel Vipond, Cloyde ReamsBrian James

## 2016-11-17 NOTE — Anesthesia Preprocedure Evaluation (Addendum)
Anesthesia Evaluation  Patient identified by MRN, date of birth, ID band Patient awake    Reviewed: Allergy & Precautions, NPO status , Patient's Chart, lab work & pertinent test results  Airway Mallampati: III  TM Distance: >3 FB Neck ROM: Full    Dental  (+) Upper Dentures, Lower Dentures   Pulmonary sleep apnea , COPD (mild, controlled),  COPD inhaler, former smoker,    Pulmonary exam normal breath sounds clear to auscultation       Cardiovascular Exercise Tolerance: Good hypertension, Pt. on medications and Pt. on home beta blockers Normal cardiovascular exam Rhythm:Regular Rate:Normal  ECG: SB, rate 49  ECHO: - Left ventricle: The cavity size was normal. There was mild focal basal hypertrophy of the septum. Systolic function was normal. The estimated ejection fraction was in the range of 55% to 60%.   Wall motion was normal; there were no regional wall motion abnormalities. Left ventricular diastolic function parameters were normal.  Cardiologist - Nasher   Neuro/Psych negative neurological ROS  negative psych ROS   GI/Hepatic (+)     substance abuse (former)  alcohol use,   Endo/Other  negative endocrine ROS  Renal/GU negative Renal ROS  negative genitourinary   Musculoskeletal negative musculoskeletal ROS (+)   Abdominal   Peds negative pediatric ROS (+)  Hematology negative hematology ROS (+)   Anesthesia Other Findings Hyperlipidemia   Reproductive/Obstetrics negative OB ROS                            Anesthesia Physical Anesthesia Plan  ASA: III  Anesthesia Plan: Spinal and Regional   Post-op Pain Management:  Regional for Post-op pain   Induction: Intravenous  PONV Risk Score and Plan: 1 and Ondansetron, Dexamethasone, Propofol and Midazolam  Airway Management Planned:   Additional Equipment:   Intra-op Plan:   Post-operative Plan:   Informed Consent: I have  reviewed the patients History and Physical, chart, labs and discussed the procedure including the risks, benefits and alternatives for the proposed anesthesia with the patient or authorized representative who has indicated his/her understanding and acceptance.   Dental advisory given  Plan Discussed with: CRNA  Anesthesia Plan Comments:         Anesthesia Quick Evaluation

## 2016-11-17 NOTE — Discharge Instructions (Signed)
° °Dr. Nusrat Encarnacion °Total Joint Specialist °[facility] °[address] °[place], [place] [address] °[phone] ° °TOTAL KNEE REPLACEMENT POSTOPERATIVE DIRECTIONS ° ° ° °Knee Rehabilitation, Guidelines Following Surgery  °Results after knee surgery are often greatly improved when you follow the exercise, range of motion and muscle strengthening exercises prescribed by your doctor. Safety measures are also important to protect the knee from further injury. Any time any of these exercises cause you to have increased pain or swelling in your knee joint, decrease the amount until you are comfortable again and slowly increase them. If you have problems or questions, call your caregiver or physical therapist for advice.  ° °WEIGHT BEARING °Weight bearing as tolerated with assist device (walker, cane, etc) as directed, use it as long as suggested by your surgeon or therapist, typically at least 4-6 weeks. ° °HOME CARE INSTRUCTIONS  °Remove items at home which could result in a fall. This includes throw rugs or furniture in walking pathways.  °Continue medications as instructed at time of discharge. °You may have some home medications which will be placed on hold until you complete the course of blood thinner medication.  °You may start showering once you are discharged home but do not submerge the incision under water. Just pat the incision dry and apply a dry gauze dressing on daily. °Walk with walker as instructed.  °You may resume a sexual relationship in one month or when given the OK by your doctor.  °· Use walker as long as suggested by your caregivers. °· Avoid periods of inactivity such as sitting longer than an hour when not asleep. This helps prevent blood clots.  °You may put full weight on your legs and walk as much as is comfortable.  °You may return to work once you are cleared by your doctor.  °Do not drive a car for 6 weeks or until released by you surgeon.  °· Do not drive  while taking narcotics.  °Wear the elastic stockings for three weeks following surgery during the day but you may remove then at night. °Make sure you keep all of your appointments after your operation with all of your doctors and caregivers. You should call the office at the above phone number and make an appointment for approximately two weeks after the date of your surgery. °Do not remove your surgical dressing. The dressing is waterproof; you may take showers in 3 days, but do not take tub baths or submerge the dressing. °Please pick up a stool softener and laxative for home use as long as you are requiring pain medications. °· ICE to the affected knee every three hours for 30 minutes at a time and then as needed for pain and swelling.  Continue to use ice on the knee for pain and swelling from surgery. You may notice swelling that will progress down to the foot and ankle.  This is normal after surgery.  Elevate the leg when you are not up walking on it.   °It is important for you to complete the blood thinner medication as prescribed by your doctor. °· Continue to use the breathing machine which will help keep your temperature down.  It is common for your temperature to cycle up and down following surgery, especially at night when you are not up moving around and exerting yourself.  The breathing machine keeps your lungs expanded and your temperature down. ° °RANGE OF MOTION AND STRENGTHENING EXERCISES  °Rehabilitation of the knee is important following   a knee injury or an operation. After just a few days of immobilization, the muscles of the thigh which control the knee become weakened and shrink (atrophy). Knee exercises are designed to build up the tone and strength of the thigh muscles and to improve knee motion. Often times heat used for twenty to thirty minutes before working out will loosen up your tissues and help with improving the range of motion but do not use heat for the first two weeks following  surgery. These exercises can be done on a training (exercise) mat, on the floor, on a table or on a bed. Use what ever works the best and is most comfortable for you Knee exercises include:  °Leg Lifts - While your knee is still immobilized in a splint or cast, you can do straight leg raises. Lift the leg to 60 degrees, hold for 3 sec, and slowly lower the leg. Repeat 10-20 times 2-3 times daily. Perform this exercise against resistance later as your knee gets better.  °Quad and Hamstring Sets - Tighten up the muscle on the front of the thigh (Quad) and hold for 5-10 sec. Repeat this 10-20 times hourly. Hamstring sets are done by pushing the foot backward against an object and holding for 5-10 sec. Repeat as with quad sets.  °A rehabilitation program following serious knee injuries can speed recovery and prevent re-injury in the future due to weakened muscles. Contact your doctor or a physical therapist for more information on knee rehabilitation.  ° °SKILLED REHAB INSTRUCTIONS: °If the patient is transferred to a skilled rehab facility following release from the hospital, a list of the current medications will be sent to the facility for the patient to continue.  When discharged from the skilled rehab facility, please have the facility set up the patient's Home Health Physical Therapy prior to being released. Also, the skilled facility will be responsible for providing the patient with their medications at time of release from the facility to include their pain medication, the muscle relaxants, and their blood thinner medication. If the patient is still at the rehab facility at time of the two week follow up appointment, the skilled rehab facility will also need to assist the patient in arranging follow up appointment in our office and any transportation needs. ° °MAKE SURE YOU:  °Understand these instructions.  °Will watch your condition.  °Will get help right away if you are not doing well or get worse.   ° ° °Pick up stool softner and laxative for home use following surgery while on pain medications. °Do NOT remove your dressing. You may shower.  °Do not take tub baths or submerge incision under water. °May shower starting three days after surgery. °Please use a clean towel to pat the incision dry following showers. °Continue to use ice for pain and swelling after surgery. °Do not use any lotions or creams on the incision until instructed by your surgeon. ° °

## 2016-11-17 NOTE — Op Note (Signed)
OPERATIVE REPORT  SURGEON: Samson FredericBrian Kenisha Lynds, MD   ASSISTANT: Hart CarwinJustin Queen, RNFA.  PREOPERATIVE DIAGNOSIS: Right knee arthritis.   POSTOPERATIVE DIAGNOSIS: Right knee arthritis.   PROCEDURE: Right total knee arthroplasty.   IMPLANTS: Stryker Triathlon CR femur, size 7. Stryker Tritanium tibia, size 6. X3 polyethelyene insert, size 9 mm, CR. 3 button asymmetric patella, size 40 mm.  ANESTHESIA:  Spinal  TOURNIQUET TIME: Not utilized.   ESTIMATED BLOOD LOSS: 250 mL.  ANTIBIOTICS: 2 g Ancef.  DRAINS: None.  COMPLICATIONS: None   CONDITION: PACU - hemodynamically stable.   BRIEF CLINICAL NOTE: Charles Leblanc is a 56 y.o. male with a long-standing history of Right knee arthritis. After failing conservative management, the patient was indicated for total knee arthroplasty. The risks, benefits, and alternatives to the procedure were explained, and the patient elected to proceed.  PROCEDURE IN DETAIL: Adductor canal block was obtained in the pre-op holding area. Once inside the operative room, spinal anesthesia was obtained, and a foley catheter was inserted. The patient was then positioned, a nonsterile tourniquet was placed, and the lower extremity was prepped and draped in the normal sterile surgical fashion. A time-out was called verifying side and site of surgery. The patient received IV antibiotics within 60 minutes of beginning the procedure. The tourniquet was not utilized.  An anterior approach to the knee was performed utilizing a midvastus arthrotomy. A medial release was performed and the patellar fat pad was excised. Stryker navigation was used to cut the distal femur perpendicular to the mechanical axis. A freehand patellar resection was performed, and the patella was sized an prepared with 3 lug holes.  Nagivation was used to make a neutral proximal tibia resection, taking  8 mm of bone from the less affected lateral side with 3 degrees of slope. The menisci were excised. A spacer block was placed, and the alignment and balance in extension were confirmed.   The distal femur was sized using the 3-degree external rotation guide referencing the posterior femoral cortex. The appropriate 4-in-1 cutting block was pinned into place. Rotation was checked using Whiteside's line, the epicondylar axis, and then confirmed with a spacer block in flexion. The remaining femoral cuts were performed, taking care to protect the MCL.  The tibia was sized and the trial tray was pinned into place. The remaining trail components were inserted. The knee was stable to varus and valgus stress through a full range of motion. The patella tracked centrally, and the PCL was well balanced. The trial components were removed, and the proximal tibial surface was prepared. Final components were impacted into place. The knee was tested for a final time and found to be well balanced.  The wound was copiously irrigated with normal saline using pulse lavage. Marcaine solution was injected into the periarticular soft tissue. The wound was closed in layers using #1 Vicryl and Stratafix for the fascia, 2-0 Vicryl for the subcutaneous fat, 2-0 Monocryl for the deep dermal layer, 3-0 running Monocryl subcuticular Stitch, and Dermabond for the skin. Once the glue was fully dried, an Aquacell Ag and compressive dressing were applied. Tthe patient was transported to the recovery room in stable condition. Sponge, needle, and instrument counts were correct at the end of the case x2. The patient tolerated the procedure well and there were no known complications.

## 2016-11-17 NOTE — Anesthesia Procedure Notes (Signed)
Spinal  Patient location during procedure: OR Start time: 11/17/2016 5:30 PM End time: 11/17/2016 5:42 PM Staffing Anesthesiologist: Sharee HolsterMASSAGEE, Charles Leblanc Performed: anesthesiologist  Preanesthetic Checklist Completed: patient identified, site marked, surgical consent, pre-op evaluation, timeout performed, IV checked, risks and benefits discussed and monitors and equipment checked Spinal Block Patient position: sitting Prep: ChloraPrep Patient monitoring: heart rate, cardiac monitor, continuous pulse ox and blood pressure Approach: midline Location: L3-4 Injection technique: single-shot Needle Needle type: Pencan  Needle gauge: 24 G Assessment Sensory level: T8

## 2016-11-17 NOTE — Anesthesia Postprocedure Evaluation (Signed)
Anesthesia Post Note  Patient: Charles ChristenJohnny C Leblanc  Procedure(s) Performed: Procedure(s) (LRB): COMPUTER ASSISTED TOTAL KNEE ARTHROPLASTY (Right)     Patient location during evaluation: PACU Anesthesia Type: Regional and Spinal Level of consciousness: awake and alert Pain management: pain level controlled Vital Signs Assessment: post-procedure vital signs reviewed and stable Respiratory status: spontaneous breathing, nonlabored ventilation, respiratory function stable and patient connected to nasal cannula oxygen Cardiovascular status: blood pressure returned to baseline and stable Postop Assessment: no signs of nausea or vomiting Anesthetic complications: no    Last Vitals:  Vitals:   11/17/16 2107 11/17/16 2126  BP: (!) 148/88 (!) 149/83  Pulse: 60 (!) 57  Resp: 12 14  Temp: 36.4 C 36.4 C    Last Pain:  Vitals:   11/17/16 2150  TempSrc:   PainSc: 6                  Lyndee Herbst DAVID

## 2016-11-18 ENCOUNTER — Encounter (HOSPITAL_COMMUNITY): Payer: Self-pay | Admitting: Orthopedic Surgery

## 2016-11-18 LAB — CBC
HCT: 37.5 % — ABNORMAL LOW (ref 39.0–52.0)
HEMOGLOBIN: 12.6 g/dL — AB (ref 13.0–17.0)
MCH: 30.5 pg (ref 26.0–34.0)
MCHC: 33.6 g/dL (ref 30.0–36.0)
MCV: 90.8 fL (ref 78.0–100.0)
PLATELETS: 250 10*3/uL (ref 150–400)
RBC: 4.13 MIL/uL — AB (ref 4.22–5.81)
RDW: 12.3 % (ref 11.5–15.5)
WBC: 10.7 10*3/uL — ABNORMAL HIGH (ref 4.0–10.5)

## 2016-11-18 LAB — BASIC METABOLIC PANEL
ANION GAP: 6 (ref 5–15)
BUN: 14 mg/dL (ref 6–20)
CHLORIDE: 105 mmol/L (ref 101–111)
CO2: 25 mmol/L (ref 22–32)
Calcium: 8.7 mg/dL — ABNORMAL LOW (ref 8.9–10.3)
Creatinine, Ser: 1.04 mg/dL (ref 0.61–1.24)
Glucose, Bld: 112 mg/dL — ABNORMAL HIGH (ref 65–99)
POTASSIUM: 3.6 mmol/L (ref 3.5–5.1)
SODIUM: 136 mmol/L (ref 135–145)

## 2016-11-18 MED ORDER — HYDROCODONE-ACETAMINOPHEN 5-325 MG PO TABS: 1.0000 | ORAL_TABLET | ORAL | 0 refills | Status: DC | PRN

## 2016-11-18 MED ORDER — ONDANSETRON HCL 4 MG PO TABS: 4.0000 mg | ORAL_TABLET | Freq: Four times a day (QID) | ORAL | 0 refills | Status: DC | PRN

## 2016-11-18 MED ORDER — OXYCODONE HCL ER 10 MG PO T12A: 10.0000 mg | EXTENDED_RELEASE_TABLET | ORAL | 0 refills | Status: DC

## 2016-11-18 MED ORDER — ASPIRIN 81 MG PO CHEW: 81.0000 mg | CHEWABLE_TABLET | Freq: Two times a day (BID) | ORAL | 1 refills | Status: DC

## 2016-11-18 MED ORDER — SENNA 8.6 MG PO TABS: 2.0000 | ORAL_TABLET | Freq: Every day | ORAL | 0 refills | Status: DC

## 2016-11-18 MED ORDER — DOCUSATE SODIUM 100 MG PO CAPS: 100.0000 mg | ORAL_CAPSULE | Freq: Two times a day (BID) | ORAL | 1 refills | Status: DC

## 2016-11-18 NOTE — Progress Notes (Signed)
   Subjective:  Patient reports pain as mild to moderate.  Denies N/V/CP/SOB. Eager to go home.  Objective:   VITALS:   Vitals:   11/17/16 2052 11/17/16 2107 11/17/16 2126 11/18/16 0500  BP: 130/83 (!) 148/88 (!) 149/83 123/77  Pulse: 65 60 (!) 57 (!) 55  Resp: 12 12 14 16   Temp:  97.6 F (36.4 C) 97.6 F (36.4 C) 97.9 F (36.6 C)  TempSrc:    Oral  SpO2: 94% 95% 98% 100%  Weight:      Height:        NAD ABD soft Sensation intact distally Intact pulses distally Dorsiflexion/Plantar flexion intact Incision: dressing C/D/I Compartment soft   Lab Results  Component Value Date   WBC 7.7 11/05/2016   HGB 14.3 11/05/2016   HCT 43.4 11/05/2016   MCV 90.8 11/05/2016   PLT 240 11/05/2016   BMET    Component Value Date/Time   NA 138 11/05/2016 0846   K 4.2 11/05/2016 0846   CL 104 11/05/2016 0846   CO2 27 11/05/2016 0846   GLUCOSE 99 11/05/2016 0846   BUN 11 11/05/2016 0846   CREATININE 1.23 11/05/2016 0846   CREATININE 1.02 09/27/2015 1145   CALCIUM 9.6 11/05/2016 0846   GFRNONAA >60 11/05/2016 0846   GFRAA >60 11/05/2016 0846     Assessment/Plan: 1 Day Post-Op   Principal Problem:   Degenerative arthritis of right knee Active Problems:   Degenerative joint disease of right knee   WBAT with walker PT/OT DVT ppx: ASA, SCDs, TEDs PO pain control Dispo: am labs pending, D/C home today if labs ok and does well with therapy, needs HHPT   Aleese Kamps, Cloyde ReamsBrian James 11/18/2016, 7:49 AM   Samson FredericBrian Alessandra Sawdey, MD Cell [phone]

## 2016-11-18 NOTE — Discharge Summary (Signed)
Physician Discharge Summary  Patient ID: Charles Leblanc MRN: [id number] DOB/AGE: 27-Nov-1960 56 y.o.  Admit date: 11/17/2016 Discharge date: 11/18/2016  Admission Diagnoses:  Degenerative arthritis of right knee  Discharge Diagnoses:  Principal Problem:   Degenerative arthritis of right knee Active Problems:   Degenerative joint disease of right knee   Past Medical History:  Diagnosis Date  . 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 Arthritis   . Bronchitis   . GERD (gastroesophageal reflux disease)   . Hyperlipidemia   . Hypertension   . Sleep apnea     Surgeries: Procedure(s): COMPUTER ASSISTED TOTAL KNEE ARTHROPLASTY on 11/17/2016   Consultants (if any):   Discharged Condition: Improved  Hospital Course: Charles Leblanc is an 56 y.o. male who was admitted 11/17/2016 with a diagnosis of Degenerative arthritis of right knee and went to the operating room on 11/17/2016 and underwent the above named procedures.    He was given perioperative antibiotics:  Anti-infectives    Start     Dose/Rate Route Frequency Ordered Stop   11/17/16 2300  ceFAZolin (ANCEF) IVPB 2g/100 mL premix     2 g 200 mL/hr over 30 Minutes Intravenous Every 6 hours 11/17/16 2132 11/18/16 0845   11/17/16 1400  ceFAZolin (ANCEF) IVPB 2g/100 mL premix     2 g 200 mL/hr over 30 Minutes Intravenous To [facility] 11/14/16 1254 11/17/16 1803    .  He was given sequential compression devices, early ambulation, and ASA for DVT prophylaxis.  He benefited maximally from the hospital stay and there were no complications.    Recent vital signs:  Vitals:   11/18/16 0500 11/18/16 0923  BP: 123/77 (!) 145/79  Pulse: (!) 55 67  Resp: 16   Temp: 97.9 F (36.6 C)     Recent laboratory studies:  Lab Results  Component Value Date   HGB 12.6 (L) 11/18/2016   HGB 14.3 11/05/2016   HGB 14.8 09/27/2015   Lab Results  Component Value Date   WBC 10.7 (H) 11/18/2016   PLT 250 11/18/2016    Lab Results  Component Value Date   INR 0.96 09/27/2015   Lab Results  Component Value Date   NA 136 11/18/2016   K 3.6 11/18/2016   CL 105 11/18/2016   CO2 25 11/18/2016   BUN 14 11/18/2016   CREATININE 1.04 11/18/2016   GLUCOSE 112 (H) 11/18/2016    Discharge Medications:   Allergies as of 11/18/2016      Reactions   Lipitor [atorvastatin] Other (See Comments)   Migraines, Myalgia      Medication List    STOP taking these medications   acetaminophen 325 MG tablet Commonly known as:  TYLENOL     TAKE these medications   aspirin 81 MG chewable tablet Chew 1 tablet (81 mg total) by mouth 2 (two) times daily.   budesonide-formoterol 160-4.5 MCG/ACT inhaler Commonly known as:  SYMBICORT Inhale 1 puff into the lungs 2 (two) times daily.   carvedilol 12.5 MG tablet Commonly known as:  COREG Take 1 tablet (12.5 mg total) by mouth 2 (two) times daily.   diclofenac 50 MG EC tablet Commonly known as:  VOLTAREN Take 50 mg by mouth 2 (two) times daily as needed for pain.   docusate sodium 100 MG capsule Commonly known as:  COLACE Take 1 capsule (100 mg total) by mouth 2 (two) times daily.   Fish Oil 1000 MG Caps Take 1,000 mg by  mouth daily.   HYDROcodone-acetaminophen 5-325 MG tablet Commonly known as:  NORCO/VICODIN Take 1-2 tablets by mouth every 4 (four) hours as needed (breakthrough pain).   lisinopril 10 MG tablet Commonly known as:  PRINIVIL,ZESTRIL Take 10 mg by mouth daily.   NON FORMULARY at bedtime. CPAP set on #10   ondansetron 4 MG tablet Commonly known as:  ZOFRAN Take 1 tablet (4 mg total) by mouth every 6 (six) hours as needed for nausea.   OVER THE COUNTER MEDICATION Take 1 tablet by mouth at bedtime. Sage   oxyCODONE 10 mg 12 hr tablet Commonly known as:  OXYCONTIN Take 1 tablet (10 mg total) by mouth PRO. 1 tab PO every 12 hours for 3 days, then 1 tab PO daily for 4 days   PROAIR HFA 108 (90 Base) MCG/ACT inhaler Generic drug:   albuterol Inhale 2 puffs into the lungs every 4 (four) hours as needed for wheezing or shortness of breath.   senna 8.6 MG Tabs tablet Commonly known as:  SENOKOT Take 2 tablets (17.2 mg total) by mouth at bedtime.       Diagnostic Studies: Dg Knee Right Port  Result Date: 11/17/2016 CLINICAL DATA:  56 year old male post knee replacement. Initial encounter. EXAM: PORTABLE RIGHT KNEE - 1-2 VIEW COMPARISON:  None. FINDINGS: Post total right knee replacement which appears in satisfactory position. IMPRESSION: Post total right knee replacement without complication noted. Electronically Signed   By: Lacy DuverneySteven  Olson M.D.   On: 11/17/2016 20:42    Disposition: 01-Home or Self Care  Discharge Instructions    Call MD / Call 911    Complete by:  As directed    If you experience chest pain or shortness of breath, CALL 911 and be transported to the hospital emergency room.  If you develope a fever above 101 F, pus (white drainage) or increased drainage or redness at the wound, or calf pain, call your surgeon's office.   Constipation Prevention    Complete by:  As directed    Drink plenty of fluids.  Prune juice may be helpful.  You may use a stool softener, such as Colace (over the counter) 100 mg twice a day.  Use MiraLax (over the counter) for constipation as needed.   Diet - low sodium heart healthy    Complete by:  As directed    Do not put a pillow under the knee. Place it under the heel.    Complete by:  As directed    Driving restrictions    Complete by:  As directed    No driving for 6 weeks   Increase activity slowly as tolerated    Complete by:  As directed    Lifting restrictions    Complete by:  As directed    No lifting for 6 weeks   TED hose    Complete by:  As directed    Use stockings (TED hose) for 2 weeks on both leg(s).  You may remove them at night for sleeping.      Follow-up Information    Deren Degrazia, Arlys JohnBrian, MD. Schedule an appointment as soon as possible for a visit  in 2 week(s).   Specialty:  Orthopedic Surgery Why:  For wound re-check Contact information: [address] [place] [address] [phone]            Signed: Garnet KoyanagiSwinteck, Nikko Goldwire James 11/19/2016, 7:53 AM

## 2016-11-18 NOTE — Progress Notes (Signed)
Physical Therapy Treatment Patient Details Name: Charles Leblanc MRN: [id number] DOB: 1960-07-05 Today's Date: 11/18/2016    History of Present Illness Patient is a 56 y/o male s/p elective R TKA on 11/17/16, with a past medical History significant for HTN, OSA, COPD, and exertional dyspnea.     PT Comments    Patient progressing very well since initial meeting this morning. Ambulating for distances greater than 200 feet as well as navigating up/down 2 stairs with use of RW with good safety awareness. Review of HEP, safety during gait and transfers, as well as safety with in home stair navigation with both patient and wife with good carryover.   Follow Up Recommendations  DC plan and follow up therapy as arranged by surgeon     Equipment Recommendations       Recommendations for Other Services       Precautions / Restrictions Precautions Precautions: Knee Precaution Booklet Issued: Yes (comment) Precaution Comments: Educated patient on elevating knee with heel propped, no pillow under knee Restrictions Weight Bearing Restrictions: Yes RLE Weight Bearing: Weight bearing as tolerated    Mobility  Bed Mobility Overal bed mobility: Modified Independent             General bed mobility comments: Set up bed to simulate home environment with HOB and bed rails lowered.   Transfers Overall transfer level: Needs assistance Equipment used: Rolling walker (2 wheeled) Transfers: Sit to/from Stand Sit to Stand: Supervision         General transfer comment: VC prior to standing to have feet underneath patient for equal weight shift and immediate standing balance  Ambulation/Gait Ambulation/Gait assistance: Supervision Ambulation Distance (Feet): 250 Feet Assistive device: Rolling walker (2 wheeled) Gait Pattern/deviations: Step-to pattern;Decreased step length - right;Decreased stance time - right;Decreased stride length;Decreased weight shift to right;Antalgic          Stairs Stairs: Yes   Stair Management: With walker Number of Stairs: 2 General stair comments: education on use of RW with stair navigation - handout given to wife and patient; good carryover.   Wheelchair Mobility    Modified Rankin (Stroke Patients Only)       Balance Overall balance assessment: Needs assistance Sitting-balance support: No upper extremity supported Sitting balance-Leahy Scale: Normal     Standing balance support: Bilateral upper extremity supported Standing balance-Leahy Scale: Fair Standing balance comment: use of B UE during transfers and ambulation                            Cognition Arousal/Alertness: Awake/alert Behavior During Therapy: WFL for tasks assessed/performed Overall Cognitive Status: Within Functional Limits for tasks assessed                                        Exercises Total Joint Exercises Ankle Circles/Pumps: Both;10 reps Quad Sets: Right;10 reps (with heel propped) Hip ABduction/ADduction: Right;10 reps Long Arc Quad: Right;15 reps Knee Flexion: Right;10 reps Goniometric ROM: R knee flexion: 95, Extension: 1 degree from neutral    General Comments General comments (skin integrity, edema, etc.): Wife present throughout       Pertinent Vitals/Pain Pain Assessment: 0-10 Pain Score: 6  Faces Pain Scale: Hurts little more Pain Location: R knee Pain Descriptors / Indicators: Aching;Tightness Pain Intervention(s): Monitored during session    Home Living Family/patient expects to be discharged to:: Private  residence Living Arrangements: Spouse/significant other Available Help at Discharge: Family Type of Home: House Home Access: Stairs to enter Entrance Stairs-Rails: Left Home Layout: One level;Able to live on main level with bedroom/bathroom Home Equipment: Walker - 4 wheels;Cane - single point Additional Comments: 2 steps down into family room    Prior Function Level of  Independence: Independent          PT Goals (current goals can now be found in the care plan section) Acute Rehab PT Goals Patient Stated Goal: return home PT Goal Formulation: With patient Time For Goal Achievement: 11/25/16 Potential to Achieve Goals: Good    Frequency    7X/week      PT Plan      Co-evaluation              AM-PAC PT "6 Clicks" Daily Activity  Outcome Measure  Difficulty turning over in bed (including adjusting bedclothes, sheets and blankets)?: None Difficulty moving from lying on back to sitting on the side of the bed? : None Difficulty sitting down on and standing up from a chair with arms (e.g., wheelchair, bedside commode, etc,.)?: A Little Help needed moving to and from a bed to chair (including a wheelchair)?: A Little Help needed walking in hospital room?: A Little Help needed climbing 3-5 steps with a railing? : A Little 6 Click Score: 20    End of Session Equipment Utilized During Treatment: Gait belt Activity Tolerance: Patient tolerated treatment well     PT Visit Diagnosis: Unsteadiness on feet (R26.81);Other abnormalities of gait and mobility (R26.89);Muscle weakness (generalized) (M62.81);Difficulty in walking, not elsewhere classified (R26.2)     Time: 1610-9604 PT Time Calculation (min) (ACUTE ONLY): 36 min  Charges:  $Gait Training: 8-22 mins $Therapeutic Exercise: 8-22 mins                    G Codes:       Kipp Laurence, PT, DPT 11/18/16 2:50 PM

## 2016-11-18 NOTE — Evaluation (Signed)
Occupational Therapy Evaluation Patient Details Name: Charles Leblanc MRN: [id number] DOB: Jul 05, 1960 Today's Date: 11/18/2016    History of Present Illness Patient is a 56 y/o male s/p elective R TKA on 11/17/16, with a past medical History significant for HTN, OSA, COPD, and exertional dyspnea.    Clinical Impression   PTA, pt was living with his wife and was independent. Currently, pt requires supervision-Min guard A for ADLs and functional mobility using RW. Provided education and handout on LB ADLs, toilet transfer, and tub transfer with 3N1; pt demonstrated understanding. Answered all pt questions. Recommend dc home once medically stable per physician. All acute OT needs met and will sign off. Thank you.     Follow Up Recommendations  DC plan and follow up therapy as arranged by surgeon;Supervision - Intermittent    Equipment Recommendations  3 in 1 bedside commode    Recommendations for Other Services PT consult     Precautions / Restrictions Precautions Precautions: Knee Precaution Booklet Issued: No Precaution Comments: Reviewed resting with knee in extension and nothing under knee Restrictions Weight Bearing Restrictions: Yes RLE Weight Bearing: Weight bearing as tolerated      Mobility Bed Mobility Overal bed mobility: Modified Independent             General bed mobility comments: Set up bed to simulate home environment with HOB and bed rails lowered.   Transfers Overall transfer level: Needs assistance Equipment used: Rolling walker (2 wheeled) Transfers: Sit to/from Stand Sit to Stand: Min guard         General transfer comment: Pr demonstrating good hand placement and control    Balance Overall balance assessment: Needs assistance Sitting-balance support: No upper extremity supported Sitting balance-Leahy Scale: Good     Standing balance support: Bilateral upper extremity supported Standing balance-Leahy Scale: Fair Standing balance comment:  Able to perform LB dressing without UE support                           ADL either performed or assessed with clinical judgement   ADL Overall ADL's : Needs assistance/impaired                                       General ADL Comments: Pt performing ADLs and functional mobility at a supervision-min guard level. Provided education on LB dressing, tub transfer with 3N1, and toilet transfer. Pt demosntrated good understanding. Provided handout on education. Wife present throughout     Vision         Perception     Praxis      Pertinent Vitals/Pain Pain Assessment: Faces Pain Score: 7  Faces Pain Scale: Hurts little more Pain Location: R knee Pain Descriptors / Indicators: Aching;Tightness Pain Intervention(s): Monitored during session;Repositioned;RN gave pain meds during session     Hand Dominance Right   Extremity/Trunk Assessment Upper Extremity Assessment Upper Extremity Assessment: Overall WFL for tasks assessed   Lower Extremity Assessment Lower Extremity Assessment: Defer to PT evaluation RLE Deficits / Details: generalized weakness of R LE due to R TKA   Cervical / Trunk Assessment Cervical / Trunk Assessment: Normal   Communication Communication Communication: No difficulties   Cognition Arousal/Alertness: Awake/alert Behavior During Therapy: WFL for tasks assessed/performed Overall Cognitive Status: Within Functional Limits for tasks assessed  General Comments  Wife present throughout session    Exercises    Shoulder Jarratt expects to be discharged to:: Private residence Living Arrangements: Spouse/significant other Available Help at Discharge: Family Type of Home: House Home Access: Stairs to enter [employer] of Steps: 3 Entrance Stairs-Rails: Left Home Layout: One level;Able to live on main level with  bedroom/bathroom     Bathroom Shower/Tub: Teacher, early years/pre: Standard     Home Equipment: Environmental consultant - 4 wheels;Cane - single point   Additional Comments: 2 steps down into family room      Prior Functioning/Environment Level of Independence: Independent                 OT Problem List: Decreased strength;Decreased range of motion;Decreased activity tolerance;Impaired balance (sitting and/or standing);Decreased safety awareness;Decreased knowledge of use of DME or AE;Decreased knowledge of precautions;Pain      OT Treatment/Interventions:      OT Goals(Current goals can be found in the care plan section) Acute Rehab OT Goals Patient Stated Goal: return home OT Goal Formulation: With patient Time For Goal Achievement: 12/02/16 Potential to Achieve Goals: Good  OT Frequency:     Barriers to D/C:            Co-evaluation              AM-PAC PT "6 Clicks" Daily Activity     Outcome Measure Help from another person eating meals?: None Help from another person taking care of personal grooming?: A Little Help from another person toileting, which includes using toliet, bedpan, or urinal?: A Little Help from another person bathing (including washing, rinsing, drying)?: A Little Help from another person to put on and taking off regular upper body clothing?: None Help from another person to put on and taking off regular lower body clothing?: A Little 6 Click Score: 20   End of Session Equipment Utilized During Treatment: Gait belt;Rolling walker Nurse Communication: Mobility status  Activity Tolerance: Patient tolerated treatment well Patient left: in chair;with call bell/phone within reach;with family/visitor present  OT Visit Diagnosis: Unsteadiness on feet (R26.81);Other abnormalities of gait and mobility (R26.89);Muscle weakness (generalized) (M62.81);Pain Pain - Right/Left: Right Pain - part of body: Knee                Time: 6301-6010 OT Time  Calculation (min): 19 min Charges:  OT General Charges $OT Visit: 1 Procedure OT Evaluation $OT Eval Low Complexity: 1 Procedure G-Codes:     Audris Speaker MSOT, OTR/L Acute Rehab Pager: [phone] Office: [place] 11/18/2016, 12:41 PM

## 2016-11-18 NOTE — Care Management Note (Signed)
Case Management Note  Patient Details  Name: Charles Leblanc MRN: [id number] Date of Birth: Dec 18, 1960  Subjective/Objective:  56 yr old gentleman s/p right total knee arthroplasty.                  Action/Plan: Case manager spoke with patient and his wife concerning discharge plan and DME needs. Patient says that he is scheduled for outpatient therapy and plans to do that. He has a rolling walker at home. 3in1 is not covered by his insurance so patient doesn't want to get it. He will have family support at discharge.   Expected Discharge Date:  11/18/16               Expected Discharge Plan:  Home/Self Care  In-House Referral:  NA  Discharge planning Services  CM Consult  Post Acute Care Choice:  NA Choice offered to:  Patient, Spouse  DME Arranged:  (S) N/A (patient has RW) DME Agency:  NA  HH Arranged:    HH Agency:   (patient going to outpatient therapy)  Status of Service:  Completed, signed off  If discussed at Long Length of Stay Meetings, dates discussed:    Additional Comments:  Durenda GuthrieBrady, Jalayia Bagheri Naomi, RN 11/18/2016, 12:57 PM

## 2016-11-18 NOTE — Progress Notes (Signed)
Discharge instructions and RX reviewed with pt and caregiver verb understanding. Pt left via wheelchair with all belongings at side and in NAD.

## 2016-11-18 NOTE — Progress Notes (Signed)
Physical Therapy Evaluation Patient Details Name: Charles Leblanc MRN: [id number] DOB: 18-Dec-1960 Today's Date: 11/18/2016   History of Present Illness  Patient is a 56 y/o male s/p elective R TKA on 11/17/16, with a past medical History significant for HTN, OSA, COPD, and exertional dyspnea.   Clinical Impression  Patient s/p elective R TKA on 11/17/16, after patient did not respond to conservative measures. Patient doing well today with PT, generally Min guard for all ambulation and transfers. Pt currently with functional limitations due to the deficits listed below (see PT Problem List). Pt will benefit from skilled PT to increase their independence and safety with mobility to allow discharge to the venue listed below.       Follow Up Recommendations DC plan and follow up therapy as arranged by surgeon    Equipment Recommendations       Recommendations for Other Services       Precautions / Restrictions Precautions Precautions: None Restrictions Weight Bearing Restrictions: Yes RLE Weight Bearing: Weight bearing as tolerated      Mobility  Bed Mobility Overal bed mobility: Modified Independent             General bed mobility comments: increased time and use of bed rails  Transfers Overall transfer level: Needs assistance Equipment used: Rolling walker (2 wheeled) Transfers: Sit to/from Stand Sit to Stand: Min guard         General transfer comment: Min Guard for immediate standing balance  Ambulation/Gait Ambulation/Gait assistance: Min guard Ambulation Distance (Feet): 120 Feet Assistive device: Rolling walker (2 wheeled) Gait Pattern/deviations: Decreased step length - left;Decreased stance time - right;Decreased dorsiflexion - right;Decreased weight shift to right;Antalgic     General Gait Details: Heavy reliance of UE with RW  Stairs            Wheelchair Mobility    Modified Rankin (Stroke Patients Only)       Balance Overall balance  assessment: Needs assistance Sitting-balance support: No upper extremity supported Sitting balance-Leahy Scale: Good     Standing balance support: Bilateral upper extremity supported Standing balance-Leahy Scale: Fair Standing balance comment: use of B UE on RW for balance                             Pertinent Vitals/Pain Pain Assessment: 0-10 Pain Score: 7  Pain Location: R knee Pain Descriptors / Indicators: Aching;Tightness Pain Intervention(s): Monitored during session;Premedicated before session;Repositioned    Home Living Family/patient expects to be discharged to:: Private residence Living Arrangements: Spouse/significant other Available Help at Discharge: Family Type of Home: House Home Access: Stairs to enter Entrance Stairs-Rails: Left Entrance Stairs-Number of Steps: 3 Home Layout: One level;Able to live on main level with bedroom/bathroom Home Equipment: Walker - 4 wheels;Cane - single point Additional Comments: 2 steps down into family room    Prior Function Level of Independence: Independent               Hand Dominance        Extremity/Trunk Assessment   Upper Extremity Assessment Upper Extremity Assessment: Defer to OT evaluation    Lower Extremity Assessment Lower Extremity Assessment: RLE deficits/detail RLE Deficits / Details: generalized weakness of R LE due to R TKA       Communication   Communication: No difficulties  Cognition Arousal/Alertness: Awake/alert Behavior During Therapy: WFL for tasks assessed/performed Overall Cognitive Status: Within Functional Limits for tasks assessed  General Comments      Exercises Total Joint Exercises Ankle Circles/Pumps: Both;10 reps Quad Sets: Right;10 reps Towel Squeeze: Both;10 reps Heel Slides: Right;10 reps Hip ABduction/ADduction: Right;10 reps Straight Leg Raises: Right;10 reps Long Arc Quad: Right;10 reps Knee  Flexion: Right;10 reps Goniometric ROM: R knee Flexion: 90; Extension: 3 degrees from neutral   Assessment/Plan    PT Assessment Patient needs continued PT services  PT Problem List Decreased strength;Decreased range of motion;Decreased activity tolerance;Decreased balance;Decreased mobility;Decreased knowledge of use of DME;Pain       PT Treatment Interventions DME instruction;Gait training;Stair training;Functional mobility training;Therapeutic activities;Therapeutic exercise;Balance training;Patient/family education    PT Goals (Current goals can be found in the Care Plan section)  Acute Rehab PT Goals Patient Stated Goal: return home PT Goal Formulation: With patient Time For Goal Achievement: 11/25/16 Potential to Achieve Goals: Good    Frequency 7X/week   Barriers to discharge        Co-evaluation               AM-PAC PT "6 Clicks" Daily Activity  Outcome Measure Difficulty turning over in bed (including adjusting bedclothes, sheets and blankets)?: A Little Difficulty moving from lying on back to sitting on the side of the bed? : A Little Difficulty sitting down on and standing up from a chair with arms (e.g., wheelchair, bedside commode, etc,.)?: Total Help needed moving to and from a bed to chair (including a wheelchair)?: A Little Help needed walking in hospital room?: A Little Help needed climbing 3-5 steps with a railing? : A Little 6 Click Score: 16    End of Session Equipment Utilized During Treatment: Gait belt Activity Tolerance: Patient tolerated treatment well;Patient limited by pain Patient left: in bed;with call bell/phone within reach;with SCD's reapplied Nurse Communication: Mobility status PT Visit Diagnosis: Unsteadiness on feet (R26.81);Other abnormalities of gait and mobility (R26.89);Muscle weakness (generalized) (M62.81);Difficulty in walking, not elsewhere classified (R26.2)    Time: [id number] PT Time Calculation (min) (ACUTE ONLY): 42  min   Charges:   PT Evaluation $PT Eval Low Complexity: 1 Procedure PT Treatments $Gait Training: 8-22 mins   PT G Codes:        Kipp LaurenceStephanie R Aaron, PT, DPT 11/18/16 10:31 AM

## 2020-12-28 ENCOUNTER — Ambulatory Visit (INDEPENDENT_AMBULATORY_CARE_PROVIDER_SITE_OTHER): Payer: Self-pay

## 2020-12-28 ENCOUNTER — Other Ambulatory Visit: Payer: Self-pay

## 2020-12-28 ENCOUNTER — Encounter: Payer: Self-pay | Admitting: Emergency Medicine

## 2020-12-28 ENCOUNTER — Ambulatory Visit
Admission: EM | Admit: 2020-12-28 | Discharge: 2020-12-28 | Disposition: A | Discharge status: Home / Self Care | Payer: BC Managed Care – PPO

## 2020-12-28 DIAGNOSIS — R0789 Other chest pain: Secondary | ICD-10-CM

## 2020-12-28 DIAGNOSIS — J449 Chronic obstructive pulmonary disease, unspecified: Secondary | ICD-10-CM

## 2020-12-28 DIAGNOSIS — R0602 Shortness of breath: Secondary | ICD-10-CM

## 2020-12-28 DIAGNOSIS — R079 Chest pain, unspecified: Secondary | ICD-10-CM

## 2020-12-28 DIAGNOSIS — R059 Cough, unspecified: Secondary | ICD-10-CM

## 2020-12-28 MED ORDER — PREDNISONE 20 MG PO TABS: ORAL_TABLET | ORAL | 0 refills | Status: DC

## 2020-12-28 MED ORDER — ALBUTEROL SULFATE HFA 108 (90 BASE) MCG/ACT IN AERS: 1.0000 | INHALATION_SPRAY | Freq: Four times a day (QID) | RESPIRATORY_TRACT | 0 refills | Status: AC | PRN

## 2020-12-28 NOTE — ED Provider Notes (Signed)
[facility]   MRN: [id number] DOB: 1960/07/05  Subjective:   Charles Leblanc is a 60 y.o. male presenting for 2 week history of shob, dyspnea. Has a history of COPD, does not have any of his inhalers. Quit smoking in 1989. Still smokes weed. He tested himself for COVID at home and was negative. No COVID vaccination.  Patient has been coughing at night, feels fatigued, dizzy.  Has also started to feel chest pain, mid to left-sided.  Denies history of MI.  He did have a cardiac cath in 2017.  He is not a diabetic.  He did have 1 sick contact with his granddaughter, was treated for pneumonia about 2 weeks ago.  No current facility-administered medications for this encounter.  Current Outpatient Medications:    budesonide-formoterol (SYMBICORT) 160-4.5 MCG/ACT inhaler, Inhale 1 puff into the lungs 2 (two) times daily., Disp: 1 Inhaler, Rfl: 6   buPROPion (WELLBUTRIN XL) 150 MG 24 hr tablet, Take 150 mg by mouth daily., Disp: , Rfl:    carvedilol (COREG) 12.5 MG tablet, Take 1 tablet (12.5 mg total) by mouth 2 (two) times daily., Disp: 60 tablet, Rfl: 11   celecoxib (CELEBREX) 200 MG capsule, Take 200 mg by mouth 2 (two) times daily., Disp: , Rfl:    hydrOXYzine (ATARAX/VISTARIL) 25 MG tablet, Take 25 mg by mouth 3 (three) times daily as needed., Disp: , Rfl:    rosuvastatin (CRESTOR) 5 MG tablet, Take 5 mg by mouth daily., Disp: , Rfl:    aspirin 81 MG chewable tablet, Chew 1 tablet (81 mg total) by mouth 2 (two) times daily., Disp: 60 tablet, Rfl: 1   diclofenac (VOLTAREN) 50 MG EC tablet, Take 50 mg by mouth 2 (two) times daily as needed for pain., Disp: , Rfl: 1   docusate sodium (COLACE) 100 MG capsule, Take 1 capsule (100 mg total) by mouth 2 (two) times daily., Disp: 60 capsule, Rfl: 1   HYDROcodone-acetaminophen (NORCO/VICODIN) 5-325 MG tablet, Take 1-2 tablets by mouth every 4 (four) hours as needed (breakthrough pain)., Disp: 80 tablet, Rfl: 0   lisinopril (PRINIVIL,ZESTRIL)  10 MG tablet, Take 10 mg by mouth daily., Disp: , Rfl: 3   NON FORMULARY, at bedtime. CPAP set on #10, Disp: , Rfl:    Omega-3 Fatty Acids (FISH OIL) 1000 MG CAPS, Take 1,000 mg by mouth daily., Disp: , Rfl:    ondansetron (ZOFRAN) 4 MG tablet, Take 1 tablet (4 mg total) by mouth every 6 (six) hours as needed for nausea., Disp: 20 tablet, Rfl: 0   OVER THE COUNTER MEDICATION, Take 1 tablet by mouth at bedtime. Sage, Disp: , Rfl:    oxyCODONE (OXYCONTIN) 10 mg 12 hr tablet, Take 1 tablet (10 mg total) by mouth PRO. 1 tab PO every 12 hours for 3 days, then 1 tab PO daily for 4 days, Disp: 10 tablet, Rfl: 0   PROAIR HFA 108 (90 Base) MCG/ACT inhaler, Inhale 2 puffs into the lungs every 4 (four) hours as needed for wheezing or shortness of breath. 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   Arthritis    Bronchitis    GERD (  gastroesophageal reflux disease)    Hyperlipidemia    Hypertension    Sleep apnea      Past Surgical History:  Procedure Laterality Date   CARDIAC CATHETERIZATION N/A 09/28/2015   Procedure: Left Heart Cath and Coronary Angiography;  Surgeon: Corky Crafts, MD;  Location: [facility] INVASIVE CV LAB;  Service: Cardiovascular;  Laterality: N/A;   COLONOSCOPY     KNEE ARTHROPLASTY Right 11/17/2016   Procedure: COMPUTER ASSISTED TOTAL KNEE ARTHROPLASTY;  Surgeon: Samson Frederic, MD;  Location: [facility] OR;  Service: Orthopedics;  Laterality: Right;   TOOTH EXTRACTION      Family History  Problem Relation Age of Onset   Heart failure Father    Colon cancer Father    Breast cancer Sister     Social History   Tobacco Use   Smoking status: Former    Packs/Leblanc: 2.00    Years: 20.00    Pack  years: 40.00    Types: Cigarettes    Quit date: 05/26/2016    Years since quitting: 4.5   Smokeless tobacco: Never  Vaping Use   Vaping Use: Never used  Substance Use Topics   Alcohol use: No    Alcohol/week: 0.0 standard drinks    Comment: recovering alcoholic stopped 23 years ago   Drug use: No    ROS   Objective:   Vitals: BP 138/90 (BP Location: Right Arm)   Pulse 75   Temp 97.6 F (36.4 C) (Oral)   Resp 18   SpO2 96%   Physical Exam Constitutional:      General: He is not in acute distress.    Appearance: Normal appearance. He is well-developed. He is not ill-appearing, toxic-appearing or diaphoretic.  HENT:     Head: Normocephalic and atraumatic.     Right Ear: External ear normal.     Left Ear: External ear normal.     Nose: Nose normal.     Mouth/Throat:     Mouth: Mucous membranes are moist.     Pharynx: Oropharynx is clear.  Eyes:     General: No scleral icterus.    Extraocular Movements: Extraocular movements intact.     Pupils: Pupils are equal, round, and reactive to light.  Cardiovascular:     Rate and Rhythm: Normal rate and regular rhythm.     Heart sounds: Normal heart sounds. No murmur heard.   No friction rub. No gallop.  Pulmonary:     Effort: Pulmonary effort is normal. No tachypnea, accessory muscle usage or respiratory distress.     Breath sounds: Normal breath sounds. No stridor. No decreased breath sounds, wheezing, rhonchi or rales.  Chest:     Chest wall: Tenderness present.    Neurological:     Mental Status: He is alert and oriented to person, place, and time.  Psychiatric:        Mood and Affect: Mood normal.        Behavior: Behavior normal.        Thought Content: Thought content normal.    ED ECG REPORT   Date: 12/28/2020  EKG Time: 9:19 AM  Rate: 80 bpm  Rhythm: normal sinus rhythm,  normal EKG, normal sinus rhythm, nonspecific ST and T waves changes  Axis: Normal  Intervals:none  ST&T Change: Nonspecific T wave  flattening  Narrative Interpretation: Sinus rhythm at 80 bpm with nonspecific T wave flattening, very comparable to previous EKG.  DG Chest 2 View  Result Date: 12/28/2020 CLINICAL DATA:  Shortness of breath.  Chest pain. EXAM: CHEST - 2 VIEW COMPARISON:  September 11, 2016. FINDINGS: The heart size and mediastinal contours are within normal limits. Mild prominence of the interstitial markings, similar to the prior. No consolidation. No visible pleural effusions or pneumothorax. Right-sided skin fold. IMPRESSION: Mild prominence of the interstitial markings, favored chronic given similar findings on the prior. No evidence of acute cardiopulmonary disease. Electronically Signed   By: Feliberto Harts MD   On: 12/28/2020 09:38     Assessment and Plan :   PDMP not reviewed this encounter.  1. Chronic obstructive pulmonary disease, unspecified COPD type (HCC)   2. Shortness of breath   3. Chest wall pain   4. Cough     Patient does not have an albuterol inhaler and therefore recommended refilling this.  Use an oral prednisone course for his cough, shortness of breath and COPD.  Counseled that I suspect this may be a viral illness given close contact with his granddaughter who had pneumonia.  He refused testing for COVID-19.  Recommended supportive care otherwise. Counseled patient on potential for adverse effects with medications prescribed/recommended today, ER and return-to-clinic precautions discussed, patient verbalized understanding.    Wallis Bamberg, [place] 12/28/20 [phone]

## 2020-12-28 NOTE — ED Triage Notes (Addendum)
C/o SOB x 2 weeks. Chest pain x 4 days, with sore throat, runny nose, nausea, severe vertigo with positional changes. Family hx of cardiac problems.

## 2020-12-28 NOTE — Progress Notes (Signed)
Discussed with patient that we will be using an oral prednisone course to manage him for respiratory symptoms likely due to his COPD versus a viral respiratory illness that he got from his granddaughter.  No need to call patient as there is no sign of pneumonia or an acute problem.  Patient to follow recommendations discussed in clinic.

## 2021-08-06 DIAGNOSIS — I10 Essential (primary) hypertension: Secondary | ICD-10-CM | POA: Diagnosis not present

## 2021-08-06 DIAGNOSIS — Z0000 Encounter for general adult medical examination without abnormal findings: Secondary | ICD-10-CM | POA: Diagnosis not present

## 2021-08-06 DIAGNOSIS — F1021 Alcohol dependence, in remission: Secondary | ICD-10-CM | POA: Diagnosis not present

## 2021-08-06 DIAGNOSIS — Z125 Encounter for screening for malignant neoplasm of prostate: Secondary | ICD-10-CM | POA: Diagnosis not present

## 2021-08-06 DIAGNOSIS — J449 Chronic obstructive pulmonary disease, unspecified: Secondary | ICD-10-CM | POA: Diagnosis not present

## 2021-08-06 DIAGNOSIS — F339 Major depressive disorder, recurrent, unspecified: Secondary | ICD-10-CM | POA: Diagnosis not present

## 2021-08-06 DIAGNOSIS — E7800 Pure hypercholesterolemia, unspecified: Secondary | ICD-10-CM | POA: Diagnosis not present

## 2021-12-08 ENCOUNTER — Emergency Department (HOSPITAL_COMMUNITY): Payer: BC Managed Care – PPO

## 2021-12-08 ENCOUNTER — Other Ambulatory Visit: Payer: Self-pay

## 2021-12-08 ENCOUNTER — Emergency Department (HOSPITAL_COMMUNITY)
Admission: EM | Admit: 2021-12-08 | Discharge: 2021-12-08 | Disposition: A | Discharge status: Home / Self Care | Payer: BC Managed Care – PPO | Attending: Emergency Medicine | Admitting: Emergency Medicine

## 2021-12-08 ENCOUNTER — Ambulatory Visit
Admission: EM | Admit: 2021-12-08 | Discharge: 2021-12-08 | Disposition: A | Discharge status: Home / Self Care | Payer: BC Managed Care – PPO | Attending: Physician Assistant | Admitting: Physician Assistant

## 2021-12-08 ENCOUNTER — Encounter (HOSPITAL_COMMUNITY): Payer: Self-pay | Admitting: Emergency Medicine

## 2021-12-08 DIAGNOSIS — R112 Nausea with vomiting, unspecified: Secondary | ICD-10-CM | POA: Diagnosis not present

## 2021-12-08 DIAGNOSIS — Z20822 Contact with and (suspected) exposure to covid-19: Secondary | ICD-10-CM | POA: Insufficient documentation

## 2021-12-08 DIAGNOSIS — I10 Essential (primary) hypertension: Secondary | ICD-10-CM | POA: Diagnosis not present

## 2021-12-08 DIAGNOSIS — R6883 Chills (without fever): Secondary | ICD-10-CM | POA: Insufficient documentation

## 2021-12-08 DIAGNOSIS — R1011 Right upper quadrant pain: Secondary | ICD-10-CM

## 2021-12-08 DIAGNOSIS — R079 Chest pain, unspecified: Secondary | ICD-10-CM | POA: Diagnosis not present

## 2021-12-08 DIAGNOSIS — K7689 Other specified diseases of liver: Secondary | ICD-10-CM | POA: Diagnosis not present

## 2021-12-08 DIAGNOSIS — B370 Candidal stomatitis: Secondary | ICD-10-CM

## 2021-12-08 DIAGNOSIS — R197 Diarrhea, unspecified: Secondary | ICD-10-CM

## 2021-12-08 DIAGNOSIS — R110 Nausea: Secondary | ICD-10-CM | POA: Diagnosis not present

## 2021-12-08 DIAGNOSIS — K5730 Diverticulosis of large intestine without perforation or abscess without bleeding: Secondary | ICD-10-CM | POA: Diagnosis not present

## 2021-12-08 DIAGNOSIS — Z79899 Other long term (current) drug therapy: Secondary | ICD-10-CM | POA: Insufficient documentation

## 2021-12-08 DIAGNOSIS — Z7982 Long term (current) use of aspirin: Secondary | ICD-10-CM | POA: Insufficient documentation

## 2021-12-08 DIAGNOSIS — Z7951 Long term (current) use of inhaled steroids: Secondary | ICD-10-CM | POA: Diagnosis not present

## 2021-12-08 DIAGNOSIS — J029 Acute pharyngitis, unspecified: Secondary | ICD-10-CM | POA: Diagnosis not present

## 2021-12-08 DIAGNOSIS — J449 Chronic obstructive pulmonary disease, unspecified: Secondary | ICD-10-CM | POA: Insufficient documentation

## 2021-12-08 DIAGNOSIS — R109 Unspecified abdominal pain: Secondary | ICD-10-CM | POA: Diagnosis not present

## 2021-12-08 DIAGNOSIS — R0602 Shortness of breath: Secondary | ICD-10-CM | POA: Diagnosis not present

## 2021-12-08 LAB — URINALYSIS, ROUTINE W REFLEX MICROSCOPIC
Bilirubin Urine: NEGATIVE
Glucose, UA: NEGATIVE mg/dL
Hgb urine dipstick: NEGATIVE
Ketones, ur: 80 mg/dL — AB
Leukocytes,Ua: NEGATIVE
Nitrite: NEGATIVE
Protein, ur: 100 mg/dL — AB
Specific Gravity, Urine: 1.031 — ABNORMAL HIGH (ref 1.005–1.030)
pH: 5 (ref 5.0–8.0)

## 2021-12-08 LAB — CBC WITH DIFFERENTIAL/PLATELET
Abs Immature Granulocytes: 0.03 10*3/uL (ref 0.00–0.07)
Basophils Absolute: 0.1 10*3/uL (ref 0.0–0.1)
Basophils Relative: 1 %
Eosinophils Absolute: 0.1 10*3/uL (ref 0.0–0.5)
Eosinophils Relative: 1 %
HCT: 46 % (ref 39.0–52.0)
Hemoglobin: 15.6 g/dL (ref 13.0–17.0)
Immature Granulocytes: 0 %
Lymphocytes Relative: 20 %
Lymphs Abs: 1.7 10*3/uL (ref 0.7–4.0)
MCH: 30 pg (ref 26.0–34.0)
MCHC: 33.9 g/dL (ref 30.0–36.0)
MCV: 88.5 fL (ref 80.0–100.0)
Monocytes Absolute: 1.5 10*3/uL — ABNORMAL HIGH (ref 0.1–1.0)
Monocytes Relative: 18 %
Neutro Abs: 5 10*3/uL (ref 1.7–7.7)
Neutrophils Relative %: 60 %
Platelets: 193 10*3/uL (ref 150–400)
RBC: 5.2 MIL/uL (ref 4.22–5.81)
RDW: 12.1 % (ref 11.5–15.5)
WBC: 8.3 10*3/uL (ref 4.0–10.5)
nRBC: 0 % (ref 0.0–0.2)

## 2021-12-08 LAB — COMPREHENSIVE METABOLIC PANEL
ALT: 42 U/L (ref 0–44)
AST: 44 U/L — ABNORMAL HIGH (ref 15–41)
Albumin: 4.2 g/dL (ref 3.5–5.0)
Alkaline Phosphatase: 84 U/L (ref 38–126)
Anion gap: 9 (ref 5–15)
BUN: 13 mg/dL (ref 6–20)
CO2: 24 mmol/L (ref 22–32)
Calcium: 9.5 mg/dL (ref 8.9–10.3)
Chloride: 103 mmol/L (ref 98–111)
Creatinine, Ser: 1.29 mg/dL — ABNORMAL HIGH (ref 0.61–1.24)
GFR, Estimated: >60 mL/min (ref >60)
Glucose, Bld: 119 mg/dL — ABNORMAL HIGH (ref 70–99)
Potassium: 3.9 mmol/L (ref 3.5–5.1)
Sodium: 136 mmol/L (ref 135–145)
Total Bilirubin: 1.2 mg/dL (ref 0.3–1.2)
Total Protein: 7.9 g/dL (ref 6.5–8.1)

## 2021-12-08 LAB — RESP PANEL BY RT-PCR (FLU A&B, COVID) ARPGX2
Influenza A by PCR: NEGATIVE
Influenza B by PCR: NEGATIVE
SARS Coronavirus 2 by RT PCR: NEGATIVE

## 2021-12-08 LAB — LIPASE, BLOOD: Lipase: 41 U/L (ref 11–51)

## 2021-12-08 LAB — POCT RAPID STREP A (OFFICE): Rapid Strep A Screen: NEGATIVE

## 2021-12-08 MED ORDER — NYSTATIN 100000 UNIT/ML MT SUSP: 500000.0000 [IU] | Freq: Four times a day (QID) | OROMUCOSAL | 0 refills | Status: DC

## 2021-12-08 MED ORDER — IOHEXOL 300 MG/ML  SOLN
75.0000 mL | Freq: Once | INTRAMUSCULAR | Status: AC | PRN
  Administered 2021-12-08: 75 mL via INTRAVENOUS

## 2021-12-08 MED ORDER — FLUCONAZOLE 150 MG PO TABS: 150.0000 mg | ORAL_TABLET | ORAL | 0 refills | Status: DC | PRN

## 2021-12-08 MED ORDER — HYDROMORPHONE HCL 1 MG/ML IJ SOLN
1.0000 mg | Freq: Once | INTRAMUSCULAR | Status: AC
  Administered 2021-12-08: 1 mg via INTRAVENOUS
  Filled 2021-12-08: qty 1

## 2021-12-08 MED ORDER — ACETAMINOPHEN 325 MG PO TABS
650.0000 mg | ORAL_TABLET | Freq: Once | ORAL | Status: AC
  Administered 2021-12-08: 650 mg via ORAL

## 2021-12-08 MED ORDER — ONDANSETRON HCL 4 MG/2ML IJ SOLN
4.0000 mg | Freq: Once | INTRAMUSCULAR | Status: AC
  Administered 2021-12-08: 4 mg via INTRAVENOUS
  Filled 2021-12-08: qty 2

## 2021-12-08 MED ORDER — LIDOCAINE 5 % EX PTCH: 1.0000 | MEDICATED_PATCH | CUTANEOUS | 0 refills | Status: DC

## 2021-12-08 MED ORDER — LACTATED RINGERS IV BOLUS
1000.0000 mL | Freq: Once | INTRAVENOUS | Status: AC
  Administered 2021-12-08: 1000 mL via INTRAVENOUS

## 2021-12-08 NOTE — ED Notes (Signed)
Patient verbalizes understanding of d/c instructions. Opportunities for questions and answers were provided. Pt d/c from ED and ambulated to lobby with wife.  

## 2021-12-08 NOTE — ED Provider Triage Note (Signed)
Emergency Medicine Provider Triage Evaluation Note  Charles Leblanc , a 61 y.o. male  was evaluated in triage.  Pt complains of abdominal pain, body aches, no appetite, diarrhea x3 days.  Diarrhea is described as darker than usual.  Abdominal pain is located on the right side.  She denies associated nausea, vomiting.  He is also noted rash that is developed over the past week.  It became a "bump" on the right side of his neck and has since spread to the front and back of his torso.  Reports subjective fever, chills at home.  Also notes thrush that has been present for the past 2 days on his tongue.  Denies chest pain, shortness of breath, urinary symptoms.  Review of Systems  Positive: See above Negative:   Physical Exam  BP (!) 179/118 (BP Location: Right Arm)   Pulse (!) 111   Temp 98.7 F (37.1 C) (Oral)   Resp 20   SpO2 100%  Gen:   Awake, no distress   Resp:  Normal effort  MSK:   Moves extremities without difficulty  Other:  Right upper quadrant tenderness.  No wheezes, rales, rhonchi auscultated on respiratory exam.  Rash noted on torso papular in nature.  Medical Decision Making  Medically screening exam initiated at 9:39 AM.  Appropriate orders placed.  Charles Leblanc was informed that the remainder of the evaluation will be completed by another provider, this initial triage assessment does not replace that evaluation, and the importance of remaining in the ED until their evaluation is complete.     Peter Garter, [place] 12/08/21 [phone]

## 2021-12-08 NOTE — ED Triage Notes (Signed)
Pt c/o sore throat, back ache, abd rash onset ~ 2-3 days ago

## 2021-12-08 NOTE — ED Provider Notes (Signed)
[facility] EMERGENCY DEPARTMENT Provider Note   CSN: [id number] Arrival date & time: 12/08/21  0920     History PMH COPD, HTN Chief Complaint  Patient presents with   Abdominal Pain    Charles Leblanc is a 61 y.o. male.  61 year old male presents with 1 week of right upper quadrant achy abdominal pain which is worse with movement and palpation, 2 days of sore throat along with decreased appetite, profuse watery nonbloody diarrhea, hot flashes and chills.  Admits to a centralized achy chest pain for past week but states it is similar to the aching pain he has all over his whole body.  Also has a erythematous lesion on his neck present for 1 week and erythematous lesions on abdomen and back for past 2 to 3 days.  Patient admits to having a [place] tick removed a few weeks ago.   Patient was seen in urgent care earlier today, was diagnosed with thrush suspected to be due to Symbicort, was advised to come to the ED due to RUQ pain and tachycardia.   Abdominal Pain Associated symptoms: chest pain, chills, diarrhea, nausea and shortness of breath   Associated symptoms: no dysuria and no vomiting        Home Medications Prior to Admission medications   Medication Sig Start Date End Date Taking? Authorizing Provider  lidocaine (LIDODERM) 5 % Place 1 patch onto the skin daily. Remove & Discard patch within 12 hours or as directed by MD 12/08/21  Yes Erick Alley, DO  albuterol (VENTOLIN HFA) 108 (90 Base) MCG/ACT inhaler Inhale 1-2 puffs into the lungs every 6 (six) hours as needed for wheezing or shortness of breath. 12/28/20   Wallis Bamberg, PA-C  aspirin 81 MG chewable tablet Chew 1 tablet (81 mg total) by mouth 2 (two) times daily. 11/18/16   Swinteck, Arlys John, MD  budesonide-formoterol (SYMBICORT) 160-4.5 MCG/ACT inhaler Inhale 1 puff into the lungs 2 (two) times daily. 10/05/15   de Dios, [place] A, MD  buPROPion (WELLBUTRIN XL) 150 MG 24 hr tablet Take 150 mg by  mouth daily.    [provider]  carvedilol (COREG) 12.5 MG tablet Take 1 tablet (12.5 mg total) by mouth 2 (two) times daily. 08/29/15   Nahser, Deloris Ping, MD  celecoxib (CELEBREX) 200 MG capsule Take 200 mg by mouth 2 (two) times daily.    [provider]  diclofenac (VOLTAREN) 50 MG EC tablet Take 50 mg by mouth 2 (two) times daily as needed for pain. 10/28/16   [provider]  docusate sodium (COLACE) 100 MG capsule Take 1 capsule (100 mg total) by mouth 2 (two) times daily. 11/18/16   Swinteck, Arlys John, MD  fluconazole (DIFLUCAN) 150 MG tablet Take 1 tablet (150 mg total) by mouth every 3 (three) days as needed. 12/08/21   Raspet, Noberto Retort, PA-C  HYDROcodone-acetaminophen (NORCO/VICODIN) 5-325 MG tablet Take 1-2 tablets by mouth every 4 (four) hours as needed (breakthrough pain). 11/18/16   Swinteck, Arlys John, MD  hydrOXYzine (ATARAX/VISTARIL) 25 MG tablet Take 25 mg by mouth 3 (three) times daily as needed.    [provider]  lisinopril (PRINIVIL,ZESTRIL) 10 MG tablet Take 10 mg by mouth daily. 10/29/16   [provider]  NON FORMULARY at bedtime. CPAP set on #10    [provider]  nystatin (MYCOSTATIN) 100000 UNIT/ML suspension Take 5 mLs (500,000 Units total) by mouth 4 (four) times daily. 12/08/21   Raspet, Noberto Retort, PA-C  Omega-3  Fatty Acids (FISH OIL) 1000 MG CAPS Take 1,000 mg by mouth daily.    [provider]  ondansetron (ZOFRAN) 4 MG tablet Take 1 tablet (4 mg total) by mouth every 6 (six) hours as needed for nausea. 11/18/16   Swinteck, Aaron Edelman, MD  OVER THE COUNTER MEDICATION Take 1 tablet by mouth at bedtime. Sage    [provider]  oxyCODONE (OXYCONTIN) 10 mg 12 hr tablet Take 1 tablet (10 mg total) by mouth PRO. 1 tab PO every 12 hours for 3 days, then 1 tab PO daily for 4 days 11/18/16   Rod Can, MD  predniSONE (DELTASONE) 20 MG tablet Take 2 tablets daily with breakfast. 12/28/20   Jaynee Eagles, PA-C  rosuvastatin  (CRESTOR) 5 MG tablet Take 5 mg by mouth daily.    [provider]  senna (SENOKOT) 8.6 MG TABS tablet Take 2 tablets (17.2 mg total) by mouth at bedtime. 11/18/16   Swinteck, Aaron Edelman, MD  ezetimibe (ZETIA) 10 MG tablet Take 1 tablet (10 mg total) by mouth daily. 08/30/15 09/14/15  Nahser, Wonda Cheng, MD      Allergies    Lipitor [atorvastatin]    Review of Systems   Review of Systems  Constitutional:  Positive for appetite change and chills.  HENT:  Negative for congestion.   Respiratory:  Positive for shortness of breath.   Cardiovascular:  Positive for chest pain.  Gastrointestinal:  Positive for abdominal pain, diarrhea and nausea. Negative for blood in stool and vomiting.  Genitourinary:  Positive for flank pain. Negative for difficulty urinating and dysuria.  Musculoskeletal:  Positive for myalgias.  Skin:  Positive for rash.  Neurological:  Negative for dizziness and light-headedness.    Physical Exam Updated Vital Signs BP 132/88   Pulse 100   Temp 98.9 F (37.2 C) (Oral)   Resp 16   SpO2 100%  Physical Exam Constitutional:      General: He is not in acute distress.    Appearance: He is not ill-appearing or toxic-appearing.  HENT:     Mouth/Throat:     Mouth: Oral lesions present.     Comments: Patchy white lesions of bilateral posterior buccal mucosa and oropharynx Cardiovascular:     Rate and Rhythm: Normal rate and regular rhythm.  Pulmonary:     Effort: Pulmonary effort is normal.     Breath sounds: Normal breath sounds.  Abdominal:     General: Abdomen is flat. Bowel sounds are normal. There is no distension.     Tenderness: There is abdominal tenderness in the right upper quadrant. There is right CVA tenderness. There is no guarding. Negative signs include obturator sign.  Skin:    General: Skin is warm and dry.     Comments: Small erythematous lesion on neck with a few small raised erythematous lesions scattered on abdomen and back. See photos.    Neurological:     General: No focal deficit present.     Mental Status: He is alert.  Psychiatric:        Mood and Affect: Mood normal.        Behavior: Behavior normal.          ED Results / Procedures / Treatments   Labs (all labs ordered are listed, but only abnormal results are displayed) Labs Reviewed  COMPREHENSIVE METABOLIC PANEL - Abnormal; Notable for the following components:      Result Value   Glucose, Bld 119 (*)    Creatinine, Ser 1.29 (*)  AST 44 (*)    All other components within normal limits  CBC WITH DIFFERENTIAL/PLATELET - Abnormal; Notable for the following components:   Monocytes Absolute 1.5 (*)    All other components within normal limits  URINALYSIS, ROUTINE W REFLEX MICROSCOPIC - Abnormal; Notable for the following components:   Color, Urine AMBER (*)    APPearance HAZY (*)    Specific Gravity, Urine 1.031 (*)    Ketones, ur 80 (*)    Protein, ur 100 (*)    Bacteria, UA RARE (*)    All other components within normal limits  RESP PANEL BY RT-PCR (FLU A&B, COVID) ARPGX2  URINE CULTURE  LIPASE, BLOOD    EKG EKG Interpretation  Date/Time:  Sunday December 08 2021 19:57:15 EDT Ventricular Rate:  82 PR Interval:  154 QRS Duration: 74 QT Interval:  352 QTC Calculation: 411 R Axis:   73 Text Interpretation: Normal sinus rhythm Normal ECG When compared with ECG of 28-Dec-2020 08:43, PREVIOUS ECG IS PRESENT when compared to prior, similar appearance. No sTEMI Confirmed by Antony Blackbird [phone]) on 12/08/2021 8:14:15 PM  Radiology CT ABDOMEN PELVIS W CONTRAST  Result Date: 12/08/2021 CLINICAL DATA:  Acute abdominal pain EXAM: CT ABDOMEN AND PELVIS WITH CONTRAST TECHNIQUE: Multidetector CT imaging of the abdomen and pelvis was performed using the standard protocol following bolus administration of intravenous contrast. RADIATION DOSE REDUCTION: This exam was performed according to the departmental dose-optimization program which includes automated  exposure control, adjustment of the mA and/or kV according to patient size and/or use of iterative reconstruction technique. CONTRAST:  38mL OMNIPAQUE IOHEXOL 300 MG/ML  SOLN COMPARISON:  12/08/2021 FINDINGS: Lower chest: No acute pleural or parenchymal lung disease. Hepatobiliary: Numerous hepatic cysts are again identified. No biliary duct dilation. The gallbladder is unremarkable. Pancreas: Unremarkable. No pancreatic ductal dilatation or surrounding inflammatory changes. Spleen: Normal in size without focal abnormality. Adrenals/Urinary Tract: Adrenal glands are unremarkable. Kidneys are normal, without renal calculi, focal lesion, or hydronephrosis. Bladder is unremarkable. Stomach/Bowel: No bowel obstruction or ileus. Normal appendix right mid abdomen. Diverticulosis of the sigmoid colon without diverticulitis. No bowel wall thickening or inflammatory change. Vascular/Lymphatic: Aortic atherosclerosis. No enlarged abdominal or pelvic lymph nodes. Reproductive: Prostate is unremarkable. Other: No free fluid or free intraperitoneal gas. No abdominal wall hernia. Musculoskeletal: No acute or destructive bony lesions. Reconstructed images demonstrate no additional findings. IMPRESSION: 1. Sigmoid diverticulosis without diverticulitis. 2. No acute intra-abdominal or intrapelvic process. 3.  Aortic Atherosclerosis (ICD10-I70.0). Electronically Signed   By: Randa Ngo M.D.   On: 12/08/2021 17:58   US Abdomen Limited RUQ (LIVER/GB)  Result Date: 12/08/2021 CLINICAL DATA:  61 year old male history of right flank pain and nausea and vomiting. EXAM: ULTRASOUND ABDOMEN LIMITED RIGHT UPPER QUADRANT COMPARISON:  None Available. FINDINGS: Gallbladder: No gallstones or wall thickening visualized. No sonographic Murphy sign noted by sonographer. Common bile duct: Diameter: 4 mm Liver: Multiple anechoic lesions with increased through transmission are noted throughout the liver, compatible with cysts, largest of which  measure up to 4.1 x 4.4 x 4.3 cm. Within normal limits in parenchymal echogenicity. Portal vein is patent on color Doppler imaging with normal direction of blood flow towards the liver. Other: None. IMPRESSION: 1. No acute findings. Specifically, no gallstones or evidence to suggest acute cholecystitis. 2. Multiple hepatic cysts. Electronically Signed   By: Vinnie Langton M.D.   On: 12/08/2021 11:07    Procedures None  Medications Ordered in ED Medications  lactated ringers bolus 1,000 mL (0 mLs  Intravenous Stopped 12/08/21 1954)  HYDROmorphone (DILAUDID) injection 1 mg (1 mg Intravenous Given 12/08/21 1651)  ondansetron (ZOFRAN) injection 4 mg (4 mg Intravenous Given 12/08/21 1658)  iohexol (OMNIPAQUE) 300 MG/ML solution 75 mL (75 mLs Intravenous Contrast Given 12/08/21 1746)    ED Course/ Medical Decision Making/ A&P                           Medical Decision Making 61 year old male presenting with 1 week of RUQ abdominal pain and 2 days of nonspecific symptoms.  Patient presented with tachycardia, was afebrile, and BP mildly elevated.  RUQ ultrasound shows multiple hepatic cysts but no acute process and no cholelithiasis. CT abdomen pelvis shows no acute intra-abdominal process, does show diverticulosis without diverticulitis of sigmoid colon. CBC and CMP nonconcerning with mildly elevated creatinine at 1.29 and AST 44.  Lipase WNL.  UA shows signs of dehydration with 80 ketones but no evidence of UTI.  Urine culture pending.  He was negative for flu and COVID.  Patient was treated with Dilaudid 1 mg IV for pain, Zofran 4 mg IV for nausea and 1 L LR for dehydration.  There is no clear etiology for patient's RUQ abdominal pain/flank pain.  CT did not show evidence of kidney stone or appendicitis.  Exam also not consistent with appendicitis.  Rash not consistent with tickborne disease, no bull's-eye rash or rash on hands or feet worrisome for Lyme disease or RMSF although tickborne diseases can  remain on differential.  Due to chest pain, EKG obtained which did not show signs of MI.  Shared decision-making used and troponin not obtained as pain was consistent with other myalgias and has been persistent for a week.   It is possible symptoms are due to viral infection and abdominal pain likely musculoskeletal in nature as it is worse with movement and nothing was found on imaging. Patient had no indication for admission and was comfortable being discharged with instructions to follow-up with PCP and was given return precautions.  If symptoms do not resolve, PCP can consider testing and treating for tickborne diseases.  He was discharged with lidocaine patches for pain, advised to also use Tylenol as needed, and instructions to increase fluid intake while diarrhea persists.  He was prescribed nystatin for thrush from urgent care earlier today.   Amount and/or Complexity of Data Reviewed Labs: ordered. Radiology: ordered.  Risk Prescription drug management.      Final Clinical Impression(s) / ED Diagnoses Final diagnoses:  Right upper quadrant abdominal pain  Nausea    Rx / DC Orders ED Discharge Orders          Ordered    lidocaine (LIDODERM) 5 %  Every 24 hours        12/08/21 2029              Erick Alley, DO 12/08/21 2236    Tegeler, Canary Brim, MD 12/10/21 0020

## 2021-12-08 NOTE — ED Provider Notes (Signed)
[facility]    CSN: [id number] Arrival date & time: 12/08/21  4166      History   Chief Complaint Chief Complaint  Patient presents with   Sore Throat    HPI Charles Leblanc is a 61 y.o. male.   Patient presents today with a several day history of URI symptoms.  He reports a severe sore throat, subjective fever and chills, congestion, rash.  He denies any chest pain, shortness of breath, nausea, vomiting.  Denies any known sick contacts.  He has not had COVID in the past.  He does have a history of COPD and has been taking his inhalers as prescribed.  Denies any recent antibiotic or steroid use.  He has not been taking any over-the-counter medication for symptom management.  He reports significant difficulty motivating himself to eat; denies any significant nausea but does have odynophagia as well as decreased appetite.  He has been able to drink without difficulty.    Patient blood pressure was elevated today.  He does report taking his blood pressure medication as prescribed.  Attributes this to discomfort and feeling poorly.  Denies any chest pain, shortness of breath, headache, dizziness, vision change.    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   Arthritis    Bronchitis    GERD (gastroesophageal reflux disease)    Hyperlipidemia    Hypertension    Sleep apnea     Patient Active Problem List   Diagnosis Date Noted   Degenerative arthritis of right knee 11/17/2016   Degenerative joint disease of right knee 11/17/2016   Precordial pain    Chest pain 08/29/2015   HTN (hypertension) 08/29/2015   COPD GOLD 0  07/26/2015   Exertional dyspnea 07/26/2015   OSA (obstructive sleep apnea) 07/26/2015    Past Surgical History:  Procedure Laterality Date   CARDIAC CATHETERIZATION N/A 09/28/2015   Procedure: Left Heart Cath and Coronary Angiography;  Surgeon: Corky Crafts, MD;  Location: [facility] INVASIVE CV LAB;   Service: Cardiovascular;  Laterality: N/A;   COLONOSCOPY     KNEE ARTHROPLASTY Right 11/17/2016   Procedure: COMPUTER ASSISTED TOTAL KNEE ARTHROPLASTY;  Surgeon: Samson Frederic, MD;  Location: [facility] OR;  Service: Orthopedics;  Laterality: Right;   TOOTH EXTRACTION         Home Medications    Prior to Admission medications   Medication Sig Start Date End Date Taking? Authorizing Provider  fluconazole (DIFLUCAN) 150 MG tablet Take 1 tablet (150 mg total) by mouth every 3 (three) days as needed. 12/08/21  Yes Bristol Osentoski K, PA-C  nystatin (MYCOSTATIN) 100000 UNIT/ML suspension Take 5 mLs (500,000 Units total) by mouth 4 (four) times daily. 12/08/21  Yes Elzabeth Mcquerry K, PA-C  albuterol (VENTOLIN HFA) 108 (90 Base) MCG/ACT inhaler Inhale 1-2 puffs into the lungs every 6 (six) hours as needed for wheezing or shortness of breath. 12/28/20   Wallis Bamberg, PA-C  aspirin 81 MG chewable tablet Chew 1 tablet (81 mg total) by mouth 2 (two) times daily. 11/18/16   Swinteck, Arlys John, MD  budesonide-formoterol (SYMBICORT) 160-4.5 MCG/ACT inhaler Inhale 1 puff into the lungs 2 (two) times daily. 10/05/15   de Dios, [place] A, MD  buPROPion (WELLBUTRIN XL) 150 MG 24 hr tablet Take 150 mg by mouth daily.    [provider]  carvedilol (COREG) 12.5 MG tablet Take 1 tablet (12.5 mg total) by mouth 2 (two) times  daily. 08/29/15   Nahser, Deloris Ping, MD  celecoxib (CELEBREX) 200 MG capsule Take 200 mg by mouth 2 (two) times daily.    [provider]  diclofenac (VOLTAREN) 50 MG EC tablet Take 50 mg by mouth 2 (two) times daily as needed for pain. 10/28/16   [provider]  docusate sodium (COLACE) 100 MG capsule Take 1 capsule (100 mg total) by mouth 2 (two) times daily. 11/18/16   Swinteck, Arlys John, MD  HYDROcodone-acetaminophen (NORCO/VICODIN) 5-325 MG tablet Take 1-2 tablets by mouth every 4 (four) hours as needed (breakthrough pain). 11/18/16   Swinteck, Arlys John, MD  hydrOXYzine (ATARAX/VISTARIL) 25  MG tablet Take 25 mg by mouth 3 (three) times daily as needed.    [provider]  lisinopril (PRINIVIL,ZESTRIL) 10 MG tablet Take 10 mg by mouth daily. 10/29/16   [provider]  NON FORMULARY at bedtime. CPAP set on #10    [provider]  Omega-3 Fatty Acids (FISH OIL) 1000 MG CAPS Take 1,000 mg by mouth daily.    [provider]  ondansetron (ZOFRAN) 4 MG tablet Take 1 tablet (4 mg total) by mouth every 6 (six) hours as needed for nausea. 11/18/16   Swinteck, Arlys John, MD  OVER THE COUNTER MEDICATION Take 1 tablet by mouth at bedtime. Sage    [provider]  oxyCODONE (OXYCONTIN) 10 mg 12 hr tablet Take 1 tablet (10 mg total) by mouth PRO. 1 tab PO every 12 hours for 3 days, then 1 tab PO daily for 4 days 11/18/16   Samson Frederic, MD  predniSONE (DELTASONE) 20 MG tablet Take 2 tablets daily with breakfast. 12/28/20   Wallis Bamberg, PA-C  rosuvastatin (CRESTOR) 5 MG tablet Take 5 mg by mouth daily.    [provider]  senna (SENOKOT) 8.6 MG TABS tablet Take 2 tablets (17.2 mg total) by mouth at bedtime. 11/18/16   Swinteck, Arlys John, MD  ezetimibe (ZETIA) 10 MG tablet Take 1 tablet (10 mg total) by mouth daily. 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 Negative for fatigue.  HENT:  Positive for congestion and sore throat. Negative for sinus pressure and sneezing.   Respiratory:  Negative for cough and shortness of breath.   Cardiovascular:  Negative for chest pain.  Gastrointestinal:  Negative for abdominal pain, diarrhea, nausea and vomiting.  Neurological:  Negative for dizziness, light-headedness and headaches.     Physical Exam Triage Vital Signs ED Triage Vitals [12/08/21 0813]  Enc Vitals Group     BP (!) 165/110     Pulse Rate (!) 111  Resp 18     Temp 99.5 F (37.5 C)     Temp Source Oral     SpO2 97 %     Weight      Height      Head Circumference      Peak Flow      Pain Score 0     Pain Loc      Pain Edu?      Excl. in GC?    No data found.  Updated Vital Signs BP (!) 160/90   Pulse (!) 111   Temp 99.5 F (37.5 C) (Oral)   Resp 18   SpO2 97%   Visual Acuity Right Eye Distance:   Left Eye Distance:   Bilateral Distance:    Right Eye Near:   Left Eye Near:    Bilateral Near:     Physical Exam Vitals reviewed.  Constitutional:      General: He is awake.     Appearance: Normal appearance. He is well-developed. He is not ill-appearing.     Comments: Very pleasant male appears stated age in no acute distress  HENT:     Head: Normocephalic and atraumatic.     Right Ear: Tympanic membrane, ear canal and external ear normal. Tympanic membrane is not erythematous or bulging.     Left Ear: Tympanic membrane, ear canal and external ear normal. Tympanic membrane is not erythematous or bulging.     Nose: Nose normal.     Mouth/Throat:     Dentition: Has dentures.     Pharynx: Uvula midline. Posterior oropharyngeal erythema present. No oropharyngeal exudate or uvula swelling.     Tonsils: No tonsillar exudate or tonsillar abscesses.     Comments: Significant erythema in posterior oropharynx with white patches noted along hard palate and buccal mucosa. Cardiovascular:      Rate and Rhythm: Normal rate and regular rhythm.     Heart sounds: Normal heart sounds, S1 normal and S2 normal. No murmur heard. Pulmonary:     Effort: Pulmonary effort is normal. No accessory muscle usage or respiratory distress.     Breath sounds: Normal breath sounds. No stridor. No wheezing, rhonchi or rales.     Comments: Clear to auscultation bilaterally Abdominal:     General: Bowel sounds are normal.     Palpations: Abdomen is soft.     Tenderness: There is abdominal tenderness in the right upper quadrant and epigastric area. There is guarding. There is no right CVA tenderness, left CVA tenderness or rebound. Positive signs include Murphy's sign.  Neurological:     Mental Status: He is alert.  Psychiatric:        Behavior: Behavior is cooperative.      UC Treatments / Results  Labs (all labs ordered are listed, but only abnormal results are displayed) Labs Reviewed  POCT RAPID STREP A (OFFICE)    EKG   Radiology No results found.  Procedures Procedures (including critical care time)  Medications Ordered in UC Medications  acetaminophen (TYLENOL) tablet 650 mg (650 mg Oral Given 12/08/21 0827)    Initial Impression / Assessment and Plan / UC Course  I have reviewed the triage vital signs and the nursing notes.  Pertinent labs & imaging results that were available during my care of the patient were reviewed by me and considered in my medical decision making (see chart for details).     Given severity of abdominal pain and tachycardia discussed that patient needs to go to  the emergency room for further evaluation and management as we do not have access to stat lab or imaging.  Patient was agreeable and will go directly to ER for further evaluation and management.  His strep test was negative in clinic.  Discussed that sore throat is likely unrelated to abdominal pain more likely thrush related to inhaled corticosteroid use.  Discussed the importance of oral  hygiene after use of this medication and he was prescribed nystatin and several doses of Diflucan.  His wife accompanied him to the clinic today and will take him directly to ER.  His vital signs were stable at the time of discharge and he is safe for private transport.  Final Clinical Impressions(s) / UC Diagnoses   Final diagnoses:  Sore throat  Chills  Diarrhea, unspecified type  Thrush, oral  RUQ pain   Discharge Instructions   None    ED Prescriptions     Medication Sig Dispense Auth. Provider   nystatin (MYCOSTATIN) 100000 UNIT/ML suspension Take 5 mLs (500,000 Units total) by mouth 4 (four) times daily. 60 mL Ezio Wieck K, PA-C   fluconazole (DIFLUCAN) 150 MG tablet Take 1 tablet (150 mg total) by mouth every 3 (three) days as needed. 2 tablet Lulabelle Desta, Noberto Retort, PA-C      PDMP not reviewed this encounter.   Jeani Hawking, PA-C 12/08/21 0165

## 2021-12-08 NOTE — Discharge Instructions (Addendum)
Thank you for allowing [place] to care for you today.  All of your labs came back reassuring and the imaging of your abdomen did not show a reason for your pain.  Please follow-up with your primary care doctor within a week of discharge and return to the ED or go to urgent care if any of your symptoms significantly worsen including your rash, pain, or if you are unable to keep fluids down.  I recommend increasing your fluid intake since you are having diarrhea so that you stay hydrated.  You were treated with pain medication, IV fluids, and an antinausea medication.  I have sent in a prescription for lidocaine patches which you can apply to your right side for pain, and recommend that you also use over-the-counter Tylenol for pain or fever.  A prescription for nystatin for thrush was previously sent to your pharmacy by the urgent care provider.

## 2021-12-08 NOTE — ED Triage Notes (Addendum)
States he was sent from [place].  C/o RUQ pain, body aches, no appetite, and diarrhea x 3 days.  Denies nausea and vomiting.  Also reports fever, chills, and bumps on R neck, abd, and back.  States bumps on neck started 1 week ago and others started in the last 2 days.

## 2021-12-09 LAB — URINE CULTURE

## 2021-12-10 DIAGNOSIS — B370 Candidal stomatitis: Secondary | ICD-10-CM | POA: Diagnosis not present

## 2021-12-10 DIAGNOSIS — R109 Unspecified abdominal pain: Secondary | ICD-10-CM | POA: Diagnosis not present

## 2021-12-10 DIAGNOSIS — M1990 Unspecified osteoarthritis, unspecified site: Secondary | ICD-10-CM | POA: Diagnosis not present

## 2022-02-17 DIAGNOSIS — M1712 Unilateral primary osteoarthritis, left knee: Secondary | ICD-10-CM | POA: Diagnosis not present

## 2022-02-17 DIAGNOSIS — M25562 Pain in left knee: Secondary | ICD-10-CM | POA: Diagnosis not present

## 2022-03-04 ENCOUNTER — Ambulatory Visit: Payer: Self-pay | Admitting: Student

## 2022-03-17 ENCOUNTER — Ambulatory Visit: Payer: Self-pay | Admitting: Student

## 2022-03-17 NOTE — H&P (View-Only) (Signed)
TOTAL KNEE ADMISSION H&P  Patient is being admitted for left total knee arthroplasty.  Subjective:  Chief Complaint:left knee pain.  HPI: Charles Leblanc, 61 y.o. male, has a history of pain and functional disability in the left knee due to arthritis and has failed non-surgical conservative treatments for greater than 12 weeks to includeNSAID's and/or analgesics, flexibility and strengthening excercises, supervised PT with diminished ADL's post treatment, and activity modification.  Onset of symptoms was gradual, starting 10 years ago with rapidlly worsening course since that time. The patient noted no past surgery on the left knee(s).  Patient currently rates pain in the left knee(s) at 10 out of 10 with activity. Patient has night pain, worsening of pain with activity and weight bearing, pain that interferes with activities of daily living, pain with passive range of motion, and crepitus.  Patient has evidence of subchondral cysts, subchondral sclerosis, periarticular osteophytes, and joint space narrowing by imaging studies. There is no active infection.  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   Arthritis    Bronchitis    GERD (gastroesophageal reflux disease)    Hyperlipidemia    Hypertension    Sleep apnea     Past Surgical History:  Procedure Laterality Date   CARDIAC CATHETERIZATION N/A 09/28/2015   Procedure: Left Heart Cath and Coronary Angiography;  Surgeon: Corky Crafts, MD;  Location: [facility] INVASIVE CV LAB;  Service: Cardiovascular;  Laterality: N/A;   COLONOSCOPY     KNEE ARTHROPLASTY  Right 11/17/2016   Procedure: COMPUTER ASSISTED TOTAL KNEE ARTHROPLASTY;  Surgeon: Samson Frederic, MD;  Location: [facility] OR;  Service: Orthopedics;  Laterality: Right;   TOOTH EXTRACTION      Current Outpatient Medications  Medication Sig Dispense Refill Last Dose   albuterol (VENTOLIN HFA) 108 (90 Base) MCG/ACT inhaler Inhale 1-2 puffs into the lungs every 6 (six) hours as needed for wheezing or shortness of breath. 18 g 0    aspirin 81 MG chewable tablet Chew 1 tablet (81 mg total) by mouth 2 (two) times daily. 60 tablet 1    budesonide-formoterol (SYMBICORT) 160-4.5 MCG/ACT inhaler Inhale 1 puff into the lungs 2 (two) times daily. 1 Inhaler 6    buPROPion (WELLBUTRIN XL) 150 MG 24 hr tablet Take 150 mg by mouth daily.      carvedilol (COREG) 12.5 MG tablet Take 1 tablet (12.5 mg total) by mouth 2 (two) times daily. 60 tablet 11    celecoxib (CELEBREX) 200 MG capsule Take 200 mg by mouth 2 (two) times daily.      diclofenac (VOLTAREN) 50 MG EC tablet Take 50 mg by mouth 2 (two) times daily as needed for pain.  1    docusate sodium (COLACE) 100 MG capsule Take 1 capsule (100 mg total) by mouth 2 (two) times daily. 60 capsule 1    fluconazole (DIFLUCAN) 150 MG tablet Take 1 tablet (150 mg total) by mouth every 3 (three) days as needed. 2 tablet 0    HYDROcodone-acetaminophen (NORCO/VICODIN) 5-325 MG tablet Take 1-2 tablets by mouth every 4 (four) hours as  needed (breakthrough pain). 80 tablet 0    hydrOXYzine (ATARAX/VISTARIL) 25 MG tablet Take 25 mg by mouth 3 (three) times daily as needed.      lidocaine (LIDODERM) 5 % Place 1 patch onto the skin daily. Remove & Discard patch within 12 hours or as directed by MD 30 patch 0    lisinopril (PRINIVIL,ZESTRIL) 10 MG tablet Take 10 mg by mouth daily.  3    NON FORMULARY at bedtime. CPAP set on #10      nystatin (MYCOSTATIN) 100000 UNIT/ML suspension Take 5 mLs (500,000 Units total) by mouth 4 (four) times daily. 60 mL 0    Omega-3 Fatty Acids (FISH OIL)  1000 MG CAPS Take 1,000 mg by mouth daily.      ondansetron (ZOFRAN) 4 MG tablet Take 1 tablet (4 mg total) by mouth every 6 (six) hours as needed for nausea. 20 tablet 0    OVER THE COUNTER MEDICATION Take 1 tablet by mouth at bedtime. Sage      oxyCODONE (OXYCONTIN) 10 mg 12 hr tablet Take 1 tablet (10 mg total) by mouth PRO. 1 tab PO every 12 hours for 3 days, then 1 tab PO daily for 4 days 10 tablet 0    predniSONE (DELTASONE) 20 MG tablet Take 2 tablets daily with breakfast. 10 tablet 0    rosuvastatin (CRESTOR) 5 MG tablet Take 5 mg by mouth daily.      senna (SENOKOT) 8.6 MG TABS tablet Take 2 tablets (17.2 mg total) by mouth at bedtime. 120 each 0    No current facility-administered medications for this visit.   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  All other systems reviewed and are negative.   Objective:  Physical Exam Constitutional:      Appearance: Normal appearance.  HENT:     Head: Normocephalic and atraumatic.     Nose: Nose normal.     Mouth/Throat:     Mouth: Mucous membranes are dry.  Eyes:     Conjunctiva/sclera: Conjunctivae normal.  Cardiovascular:     Rate and Rhythm: Normal rate and regular rhythm.     Pulses: Normal pulses.     Heart sounds: Normal heart sounds.  Pulmonary:     Effort: Pulmonary effort is normal.      Breath sounds: Normal breath sounds.  Abdominal:     General: Abdomen is flat.     Palpations: Abdomen is soft.  Genitourinary:    Comments: deferred Musculoskeletal:     Cervical back: Neck supple.     Comments: Examination of the left knee reveals no skin wounds or lesions. Mild swelling, no effusion noted. Pain with palpation of the medial joint line, with patellar  motion and a positive grind sign. Crepitus noted. He has good range of motion. No ligamentous instability noted. No extensor lag.   Sensory and motor function intact in LE bilaterally. Distal pedal pulses 2+ bilaterally.   No significant pedal edema. Calves soft and non-tender.   Skin:    General: Skin is warm and dry.     Capillary Refill: Capillary refill takes less than 2 seconds.  Neurological:     General: No focal deficit present.     Mental Status: He is alert and oriented to person, place, and time.  Psychiatric:        Mood and Affect: Mood normal.        Behavior: Behavior normal.        Thought Content: Thought content normal.        Judgment: Judgment normal.     Vital signs in last 24 hours: @VSRANGES @  Labs:   Estimated body mass index is 25.08 kg/m as calculated from the following:   Height as of 11/17/16: 5\' 11"  (1.803 m).   Weight as of 11/17/16: 81.6 kg.   Imaging Review Plain radiographs demonstrate severe degenerative joint disease of the left knee(s). The overall alignment ismild varus. The bone quality appears to be adequate for age and reported activity level.      Assessment/Plan:  End stage arthritis, left knee   The patient history, physical examination, clinical judgment of the provider and imaging studies are consistent with end stage degenerative joint disease of the left knee(s) and total knee arthroplasty is deemed medically necessary. The treatment options including medical management, injection therapy arthroscopy and arthroplasty were discussed at length. The risks and  benefits of total knee arthroplasty were presented and reviewed. The risks due to aseptic loosening, infection, stiffness, patella tracking problems, thromboembolic complications and other imponderables were discussed. The patient acknowledged the explanation, agreed to proceed with the plan and consent was signed. Patient is being admitted for inpatient treatment for surgery, pain control, PT, OT, prophylactic antibiotics, VTE prophylaxis, progressive ambulation and ADL's and discharge planning. The patient is planning to be discharged home with OPPT.   Therapy Plans: outpatient therapy. [facility]. 04/07/22 first visit.  Disposition: Home with wife Planned DVT Prophylaxis: aspirin 81mg  BID DME needed: walker. Iceman today.  PCP: Cleared TXA: IV Allergies:  - Lipitor - migraines, myalgia Anesthesia Concerns: None BMI: 26.9 Last HgbA1c: Not diabetic Other: - COPD - OSA, doesn't use CPAP.  - No NSAIDs GI issues - Cr. 1.29, Hgb 15.6 12/08/21. - Oxycodone, zofran, methocarbamol.     Patient's anticipated LOS is less than 2 midnights, meeting these requirements: - Younger than 23 - Lives within 1 hour of care - Has a competent adult at home to recover with post-op recover - NO history of  - Chronic pain requiring opiods  - Diabetes  - Coronary Artery Disease  - Heart failure  - Heart attack  - Stroke  - DVT/VTE  - Cardiac arrhythmia  - Respiratory Failure/COPD  - Renal failure  - Anemia  - Advanced Liver disease

## 2022-03-17 NOTE — H&P (Signed)
TOTAL KNEE ADMISSION H&P  Patient is being admitted for left total knee arthroplasty.  Subjective:  Chief Complaint:left knee pain.  HPI: Charles Leblanc, 61 y.o. male, has a history of pain and functional disability in the left knee due to arthritis and has failed non-surgical conservative treatments for greater than 12 weeks to includeNSAID's and/or analgesics, flexibility and strengthening excercises, supervised PT with diminished ADL's post treatment, and activity modification.  Onset of symptoms was gradual, starting 10 years ago with rapidlly worsening course since that time. The patient noted no past surgery on the left knee(s).  Patient currently rates pain in the left knee(s) at 10 out of 10 with activity. Patient has night pain, worsening of pain with activity and weight bearing, pain that interferes with activities of daily living, pain with passive range of motion, and crepitus.  Patient has evidence of subchondral cysts, subchondral sclerosis, periarticular osteophytes, and joint space narrowing by imaging studies. There is no active infection.  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   Arthritis    Bronchitis    GERD (gastroesophageal reflux disease)    Hyperlipidemia    Hypertension    Sleep apnea     Past Surgical History:  Procedure Laterality Date   CARDIAC CATHETERIZATION N/A 09/28/2015   Procedure: Left Heart Cath and Coronary Angiography;  Surgeon: Jayadeep S Varanasi, MD;  Location: [facility];  Service: Cardiovascular;  Laterality: N/A;   COLONOSCOPY     KNEE ARTHROPLASTY  Right 11/17/2016   Procedure: COMPUTER ASSISTED TOTAL KNEE ARTHROPLASTY;  Surgeon: Swinteck, Brian, MD;  Location: [facility];  Service: Orthopedics;  Laterality: Right;   TOOTH EXTRACTION      Current Outpatient Medications  Medication Sig Dispense Refill Last Dose   albuterol (VENTOLIN HFA) 108 (90 Base) MCG/ACT inhaler Inhale 1-2 puffs into the lungs every 6 (six) hours as needed for wheezing or shortness of breath. 18 g 0    aspirin 81 MG chewable tablet Chew 1 tablet (81 mg total) by mouth 2 (two) times daily. 60 tablet 1    budesonide-formoterol (SYMBICORT) 160-4.5 MCG/ACT inhaler Inhale 1 puff into the lungs 2 (two) times daily. 1 Inhaler 6    buPROPion (WELLBUTRIN XL) 150 MG 24 hr tablet Take 150 mg by mouth daily.      carvedilol (COREG) 12.5 MG tablet Take 1 tablet (12.5 mg total) by mouth 2 (two) times daily. 60 tablet 11    celecoxib (CELEBREX) 200 MG capsule Take 200 mg by mouth 2 (two) times daily.      diclofenac (VOLTAREN) 50 MG EC tablet Take 50 mg by mouth 2 (two) times daily as needed for pain.  1    docusate sodium (COLACE) 100 MG capsule Take 1 capsule (100 mg total) by mouth 2 (two) times daily. 60 capsule 1    fluconazole (DIFLUCAN) 150 MG tablet Take 1 tablet (150 mg total) by mouth every 3 (three) days as needed. 2 tablet 0    HYDROcodone-acetaminophen (NORCO/VICODIN) 5-325 MG tablet Take 1-2 tablets by mouth every 4 (four) hours as   needed (breakthrough pain). 80 tablet 0    hydrOXYzine (ATARAX/VISTARIL) 25 MG tablet Take 25 mg by mouth 3 (three) times daily as needed.      lidocaine (LIDODERM) 5 % Place 1 patch onto the skin daily. Remove & Discard patch within 12 hours or as directed by MD 30 patch 0    lisinopril (PRINIVIL,ZESTRIL) 10 MG tablet Take 10 mg by mouth daily.  3    NON FORMULARY at bedtime. CPAP set on #10      nystatin (MYCOSTATIN) 100000 UNIT/ML suspension Take 5 mLs (500,000 Units total) by mouth 4 (four) times daily. 60 mL 0    Omega-3 Fatty Acids (FISH OIL)  1000 MG CAPS Take 1,000 mg by mouth daily.      ondansetron (ZOFRAN) 4 MG tablet Take 1 tablet (4 mg total) by mouth every 6 (six) hours as needed for nausea. 20 tablet 0    OVER THE COUNTER MEDICATION Take 1 tablet by mouth at bedtime. Sage      oxyCODONE (OXYCONTIN) 10 mg 12 hr tablet Take 1 tablet (10 mg total) by mouth PRO. 1 tab PO every 12 hours for 3 days, then 1 tab PO daily for 4 days 10 tablet 0    predniSONE (DELTASONE) 20 MG tablet Take 2 tablets daily with breakfast. 10 tablet 0    rosuvastatin (CRESTOR) 5 MG tablet Take 5 mg by mouth daily.      senna (SENOKOT) 8.6 MG TABS tablet Take 2 tablets (17.2 mg total) by mouth at bedtime. 120 each 0    No current facility-administered medications for this visit.   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  All other systems reviewed and are negative.   Objective:  Physical Exam Constitutional:      Appearance: Normal appearance.  HENT:     Head: Normocephalic and atraumatic.     Nose: Nose normal.     Mouth/Throat:     Mouth: Mucous membranes are dry.  Eyes:     Conjunctiva/sclera: Conjunctivae normal.  Cardiovascular:     Rate and Rhythm: Normal rate and regular rhythm.     Pulses: Normal pulses.     Heart sounds: Normal heart sounds.  Pulmonary:     Effort: Pulmonary effort is normal.      Breath sounds: Normal breath sounds.  Abdominal:     General: Abdomen is flat.     Palpations: Abdomen is soft.  Genitourinary:    Comments: deferred Musculoskeletal:     Cervical back: Neck supple.     Comments: Examination of the left knee reveals no skin wounds or lesions. Mild swelling, no effusion noted. Pain with palpation of the medial joint line, with patellar  motion and a positive grind sign. Crepitus noted. He has good range of motion. No ligamentous instability noted. No extensor lag.   Sensory and motor function intact in LE bilaterally. Distal pedal pulses 2+ bilaterally.   No significant pedal edema. Calves soft and non-tender.   Skin:    General: Skin is warm and dry.     Capillary Refill: Capillary refill takes less than 2 seconds.  Neurological:     General: No focal deficit present.     Mental Status: He is alert and oriented to person, place, and time.  Psychiatric:        Mood and Affect: Mood normal.        Behavior: Behavior normal.        Thought Content: Thought content normal.        Judgment: Judgment normal.     Vital signs in last 24 hours: @VSRANGES@  Labs:   Estimated body mass index is 25.08 kg/m as calculated from the following:   Height as of 11/17/16: 5' 11" (1.803 m).   Weight as of 11/17/16: 81.6 kg.   Imaging Review Plain radiographs demonstrate severe degenerative joint disease of the left knee(s). The overall alignment ismild varus. The bone quality appears to be adequate for age and reported activity level.      Assessment/Plan:  End stage arthritis, left knee   The patient history, physical examination, clinical judgment of the provider and imaging studies are consistent with end stage degenerative joint disease of the left knee(s) and total knee arthroplasty is deemed medically necessary. The treatment options including medical management, injection therapy arthroscopy and arthroplasty were discussed at length. The risks and  benefits of total knee arthroplasty were presented and reviewed. The risks due to aseptic loosening, infection, stiffness, patella tracking problems, thromboembolic complications and other imponderables were discussed. The patient acknowledged the explanation, agreed to proceed with the plan and consent was signed. Patient is being admitted for inpatient treatment for surgery, pain control, PT, OT, prophylactic antibiotics, VTE prophylaxis, progressive ambulation and ADL's and discharge planning. The patient is planning to be discharged home with OPPT.   Therapy Plans: outpatient therapy. [facility]. 04/07/22 first visit.  Disposition: Home with wife Planned DVT Prophylaxis: aspirin 81mg BID DME needed: walker. Iceman today.  PCP: Cleared TXA: IV Allergies:  - Lipitor - migraines, myalgia Anesthesia Concerns: None BMI: 26.9 Last HgbA1c: Not diabetic Other: - COPD - OSA, doesn't use CPAP.  - No NSAIDs GI issues - Cr. 1.29, Hgb 15.6 12/08/21. - Oxycodone, zofran, methocarbamol.     Patient's anticipated LOS is less than 2 midnights, meeting these requirements: - Younger than 65 - Lives within 1 hour of care - Has a competent adult at home to recover with post-op recover - NO history of  - Chronic pain requiring opiods  - Diabetes  - Coronary Artery Disease  - Heart failure  - Heart attack  - Stroke  - DVT/VTE  - Cardiac arrhythmia  - Respiratory Failure/COPD  - Renal failure  - Anemia  - Advanced Liver disease   

## 2022-03-24 NOTE — Patient Instructions (Signed)
DUE TO COVID-19 ONLY TWO VISITORS  (aged 60 and older)  ARE ALLOWED TO COME WITH YOU AND STAY IN THE WAITING ROOM ONLY DURING PRE OP AND PROCEDURE.   **NO VISITORS ARE ALLOWED IN THE SHORT STAY AREA OR RECOVERY ROOM!!**  IF YOU WILL BE ADMITTED INTO THE HOSPITAL YOU ARE ALLOWED ONLY FOUR SUPPORT PEOPLE DURING VISITATION HOURS ONLY (7 AM -8PM)   The support person(s) must pass our screening, gel in and out, and wear a mask at all times, including in the patient's room. Patients must also wear a mask when staff or their support person are in the room. Visitors GUEST BADGE MUST BE WORN VISIBLY  One adult visitor may remain with you overnight and MUST be in the room by 8 P.M.     Your procedure is scheduled on: 04/02/22   Report to [facility] Main Entrance    Report to admitting at 6:00 AM   Call this number if you have problems the morning of surgery [phone]   Do not eat food :After Midnight.   After Midnight you may have the following liquids until __5:30____ AM/  DAY OF SURGERY  Water Black Coffee (sugar ok, NO MILK/CREAM OR CREAMERS)  Tea (sugar ok, NO MILK/CREAM OR CREAMERS) regular and decaf                             Plain Jell-O (NO RED)                                           Fruit ices (not with fruit pulp, NO RED)                                     Popsicles (NO RED)                                                                  Juice: apple, WHITE grape, WHITE cranberry Sports drinks like Gatorade (NO RED)                   The day of surgery:  Drink ONE (1) Pre-Surgery Clear Ensure  at 5:15 AM the morning of surgery. Drink in one sitting. Do not sip.  This drink was given to you during your hospital  pre-op appointment visit. Nothing else to drink after completing the  Pre-Surgery Clear Ensure at 5:30 AM          If you have questions, please contact your surgeon's office.   FOLLOW BOWEL PREP AND ANY ADDITIONAL PRE OP INSTRUCTIONS YOU RECEIVED  FROM YOUR SURGEON'S OFFICE!!!     Oral Hygiene is also important to reduce your risk of infection.                                    Remember - BRUSH YOUR TEETH THE MORNING OF SURGERY WITH YOUR REGULAR TOOTHPASTE   Do NOT smoke after Midnight   Take  these medicines the morning of surgery with A SIP OF WATER:   DO NOT TAKE ANY ORAL DIABETIC MEDICATIONS DAY OF YOUR SURGERY  Pain med if needed                                                                                                                                         Use your inhaler and bring it with you                                                                                                                                         Bupropion-Wellbutrin                                                                                                                                         Carvedilol-Coreg       Bring CPAP mask and tubing day of surgery.                              You may not have any metal on your body including  jewelry, and body piercing             Do not wear lotions, powders, Cologne, or deodorant               Men may shave face and neck.   Do not bring valuables to the hospital. [facility] IS NOT             RESPONSIBLE   FOR VALUABLES.   Contacts, dentures or bridgework may not be worn into surgery.   DO NOT BRING YOUR HOME MEDICATIONS TO THE HOSPITAL.  Patients discharged on the day of surgery will not be allowed to drive home.  Someone NEEDS to stay with you for the first 24 hours after anesthesia.   Special Instructions: Bring a copy of your healthcare power of attorney and living will documents the day of surgery if you haven't scanned them before.              Please read over the following fact sheets you were given: IF YOU HAVE QUESTIONS ABOUT YOUR PRE-OP INSTRUCTIONS PLEASE CALL [phone]     [facility] - Preparing for Surgery Before surgery, you can play an important role.   Because skin is not sterile, your skin needs to be as free of germs as possible.  You can reduce the number of germs on your skin by washing with CHG (chlorahexidine gluconate) soap before surgery.  CHG is an antiseptic cleaner which kills germs and bonds with the skin to continue killing germs even after washing. Please DO NOT use if you have an allergy to CHG or antibacterial soaps.  If your skin becomes reddened/irritated stop using the CHG and inform your nurse when you arrive at Short Stay.  You may shave your face/neck. Please follow these instructions carefully:  1.  Shower with CHG Soap the night before surgery and the  morning of Surgery.  2.  If you choose to wash your hair, wash your hair first as usual with your  normal  shampoo.  3.  After you shampoo, rinse your hair and body thoroughly to remove the  shampoo.                            4.  Use CHG as you would any other liquid soap.  You can apply chg directly  to the skin and wash                       Gently with a scrungie or clean washcloth.  5.  Apply the CHG Soap to your body ONLY FROM THE NECK DOWN.   Do not use on face/ open                           Wound or open sores. Avoid contact with eyes, ears mouth and genitals (private parts).                       Wash face,  Genitals (private parts) with your normal soap.             6.  Wash thoroughly, paying special attention to the area where your surgery  will be performed.  7.  Thoroughly rinse your body with warm water from the neck down.  8.  DO NOT shower/wash with your normal soap after using and rinsing off  the CHG Soap.                9.  Pat yourself dry with a clean towel.            10.  Wear clean pajamas.            11.  Place clean sheets on your bed the night of your first shower and do not  sleep with pets. Day of Surgery : Do not apply any lotions/deodorants the morning of surgery.  Please wear clean clothes to the hospital/surgery center.  FAILURE TO  FOLLOW  THESE INSTRUCTIONS MAY RESULT IN THE CANCELLATION OF YOUR SURGERY    ________________________________________________________________________   Incentive Spirometer  An incentive spirometer is a tool that can help keep your lungs clear and active. This tool measures how well you are filling your lungs with each breath. Taking long deep breaths may help reverse or decrease the chance of developing breathing (pulmonary) problems (especially infection) following: A long period of time when you are unable to move or be active. BEFORE THE PROCEDURE  If the spirometer includes an indicator to show your best effort, your nurse or respiratory therapist will set it to a desired goal. If possible, sit up straight or lean slightly forward. Try not to slouch. Hold the incentive spirometer in an upright position. INSTRUCTIONS FOR USE  Sit on the edge of your bed if possible, or sit up as far as you can in bed or on a chair. Hold the incentive spirometer in an upright position. Breathe out normally. Place the mouthpiece in your mouth and seal your lips tightly around it. Breathe in slowly and as deeply as possible, raising the piston or the ball toward the top of the column. Hold your breath for 3-5 seconds or for as long as possible. Allow the piston or ball to fall to the bottom of the column. Remove the mouthpiece from your mouth and breathe out normally. Rest for a few seconds and repeat Steps 1 through 7 at least 10 times every 1-2 hours when you are awake. Take your time and take a few normal breaths between deep breaths. The spirometer may include an indicator to show your best effort. Use the indicator as a goal to work toward during each repetition. After each set of 10 deep breaths, practice coughing to be sure your lungs are clear. If you have an incision (the cut made at the time of surgery), support your incision when coughing by placing a pillow or rolled up towels firmly against it. Once  you are able to get out of bed, walk around indoors and cough well. You may stop using the incentive spirometer when instructed by your caregiver.  RISKS AND COMPLICATIONS Take your time so you do not get dizzy or light-headed. If you are in pain, you may need to take or ask for pain medication before doing incentive spirometry. It is harder to take a deep breath if you are having pain. AFTER USE Rest and breathe slowly and easily. It can be helpful to keep track of a log of your progress. Your caregiver can provide you with a simple table to help with this. If you are using the spirometer at home, follow these instructions: SEEK MEDICAL CARE IF:  You are having difficultly using the spirometer. You have trouble using the spirometer as often as instructed. Your pain medication is not giving enough relief while using the spirometer. You develop fever of 100.5 F (38.1 C) or higher. SEEK IMMEDIATE MEDICAL CARE IF:  You cough up bloody sputum that had not been present before. You develop fever of 102 F (38.9 C) or greater. You develop worsening pain at or near the incision site. MAKE SURE YOU:  Understand these instructions. Will watch your condition. Will get help right away if you are not doing well or get worse. Document Released: 09/22/2006 Document Revised: 08/04/2011 Document Reviewed: 11/23/2006 [facility] Patient Information 2014 [place], [place].   ________________________________________________________________________

## 2022-03-27 ENCOUNTER — Other Ambulatory Visit: Payer: Self-pay

## 2022-03-27 ENCOUNTER — Encounter (HOSPITAL_COMMUNITY): Payer: Self-pay

## 2022-03-27 ENCOUNTER — Encounter (HOSPITAL_COMMUNITY)
Admission: RE | Admit: 2022-03-27 | Discharge: 2022-03-27 | Disposition: A | Discharge status: Home / Self Care | Payer: BC Managed Care – PPO | Source: Ambulatory Visit | Attending: Orthopedic Surgery | Admitting: Orthopedic Surgery

## 2022-03-27 DIAGNOSIS — Z01818 Encounter for other preprocedural examination: Secondary | ICD-10-CM

## 2022-03-27 DIAGNOSIS — I10 Essential (primary) hypertension: Secondary | ICD-10-CM | POA: Diagnosis not present

## 2022-03-27 DIAGNOSIS — Z01812 Encounter for preprocedural laboratory examination: Secondary | ICD-10-CM | POA: Diagnosis not present

## 2022-03-27 HISTORY — DX: Chronic obstructive pulmonary disease, unspecified: J44.9

## 2022-03-27 LAB — CBC
HCT: 48.8 % (ref 39.0–52.0)
Hemoglobin: 15.8 g/dL (ref 13.0–17.0)
MCH: 30.2 pg (ref 26.0–34.0)
MCHC: 32.4 g/dL (ref 30.0–36.0)
MCV: 93.3 fL (ref 80.0–100.0)
Platelets: 269 10*3/uL (ref 150–400)
RBC: 5.23 MIL/uL (ref 4.22–5.81)
RDW: 11.9 % (ref 11.5–15.5)
WBC: 7.9 10*3/uL (ref 4.0–10.5)
nRBC: 0 % (ref 0.0–0.2)

## 2022-03-27 LAB — BASIC METABOLIC PANEL
Anion gap: 8 (ref 5–15)
BUN: 10 mg/dL (ref 6–20)
CO2: 29 mmol/L (ref 22–32)
Calcium: 9.6 mg/dL (ref 8.9–10.3)
Chloride: 105 mmol/L (ref 98–111)
Creatinine, Ser: 1.14 mg/dL (ref 0.61–1.24)
GFR, Estimated: >60 mL/min (ref >60)
Glucose, Bld: 98 mg/dL (ref 70–99)
Potassium: 4.9 mmol/L (ref 3.5–5.1)
Sodium: 142 mmol/L (ref 135–145)

## 2022-03-27 LAB — SURGICAL PCR SCREEN
MRSA, PCR: NEGATIVE
Staphylococcus aureus: NEGATIVE

## 2022-03-27 NOTE — Progress Notes (Addendum)
Anesthesia note:  Bowel prep reminder:  /No  PCP - Dr. Barth Kirks [place] Cardiologist -Dr. Rae Halsted Other-   Chest x-ray - no EKG - 12/16/21-epic Stress Test - 2017 ECHO - 2017 Cardiac Cath - 2017 CABG-no Pacemaker/ICD device last checked:no  Sleep Study - yes- can't tolerate the mask CPAP - no  Pt is pre diabetic-NA CBG at PAT visit- Fasting Blood Sugar at home- Checks Blood Sugar _____  Blood Thinner:ASA 81 Blood Thinner Instructions: Aspirin Instructions:none Last Dose:  Anesthesia review: Yes  reason: cardiac  Patient denies shortness of breath, fever, cough and chest pain at PAT appointment Pt has no SOB with activities but has an inhaler for COPD that he uses PRN  Patient verbalized understanding of instructions that were given to them at the PAT appointment. Patient was also instructed that they will need to review over the PAT instructions again at home before surgery. yes

## 2022-03-28 NOTE — Progress Notes (Signed)
Anesthesia Chart Review   Case: [id number] Date/Time: 04/02/22 0815   Procedure: COMPUTER ASSISTED TOTAL KNEE ARTHROPLASTY (Left: Knee) - 150   Anesthesia type: Spinal   Pre-op diagnosis: Left knee osteoarthritis   Location: [facility] ROOM 07 / [facility]   Surgeons: Rod Can, MD       DISCUSSION:61 y.o. former smoker with h/o HTN, sleep apnea, COPD, left knee OA scheduled for above procedure 04/02/2022 with Dr. Rod Can.   Pt seen by cardiology for evaluation prior to previous knee surgery.    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   Anticipate pt can proceed with planned procedure barring acute status change.   VS: BP (!) 150/85   Pulse 69   Temp 36.8 C (Oral)   Resp 18   Ht 5\' 11"  (1.803 m)   Wt 82.6 kg   SpO2 97%   BMI 25.38 kg/m   PROVIDERS: Lennie Odor, PA is PCP    LABS: Labs reviewed: Acceptable for surgery. (all labs ordered are listed, but only abnormal results are displayed)  Labs Reviewed  SURGICAL PCR SCREEN  BASIC METABOLIC PANEL  CBC     IMAGES:   EKG:   CV: Cardiac Cath 09/28/2015 Ost 2nd Diag lesion, 10% stenosed. Mid LAD lesion, 20% stenosed. The left ventricular systolic function is normal. Normal LVEDP. The patient had some right radial vasospasm requiring intra-arterial nitroglycerin.   Minimal coronary artery plaque noted. Continue aggressive preventative therapy. Investigate noncardiac etiologies of his symptoms.  09/14/2015 - Left ventricle: The cavity size was normal. There was mild focal    basal hypertrophy of the septum. Systolic function was normal.    The estimated ejection fraction was in the range of 55% to 60%.    Wall motion was normal; there were no regional wall motion    abnormalities. Left ventricular diastolic function parameters    were normal.  Past Medical History:  Diagnosis Date   Alcoholic ([place])    stopped drinking 23 years ago   Arthritis    COPD (chronic obstructive pulmonary disease)  (HCC)    GERD (gastroesophageal reflux disease)    Hyperlipidemia    Hypertension    Sleep apnea     Past Surgical History:  Procedure Laterality Date   CARDIAC CATHETERIZATION N/A 09/28/2015   Procedure: Left Heart Cath and Coronary Angiography;  Surgeon: Jettie Booze, MD;  Location: [facility];  Service: Cardiovascular;  Laterality: N/A;   COLONOSCOPY     KNEE ARTHROPLASTY Right 11/17/2016   Procedure: COMPUTER ASSISTED TOTAL KNEE ARTHROPLASTY;  Surgeon: Rod Can, MD;  Location: [facility];  Service: Orthopedics;  Laterality: Right;   KNEE ARTHROPLASTY Right 2010   MULTIPLE TOOTH EXTRACTIONS  2000    MEDICATIONS:  albuterol (VENTOLIN HFA) 108 (90 Base) MCG/ACT inhaler   BREZTRI AEROSPHERE 160-9-4.8 MCG/ACT AERO   carvedilol (COREG) 25 MG tablet   hydrOXYzine (ATARAX/VISTARIL) 25 MG tablet   ibuprofen (ADVIL) 200 MG tablet   NON FORMULARY   rosuvastatin (CRESTOR) 5 MG tablet   No current facility-administered medications for this encounter.   Konrad Felix Ward, PA-C [facility]-Surgical Testing [phone]

## 2022-03-28 NOTE — Anesthesia Preprocedure Evaluation (Signed)
Anesthesia Evaluation  Patient identified by MRN, date of birth, ID band Patient awake    Reviewed: Allergy & Precautions, NPO status , Patient's Chart, lab work & pertinent test results  Airway Mallampati: II  TM Distance: >3 FB Neck ROM: Full    Dental  (+) Edentulous Upper, Edentulous Lower   Pulmonary sleep apnea , COPD (mild, controlled),  COPD inhaler, former smoker   Pulmonary exam normal        Cardiovascular Exercise Tolerance: Good hypertension, Pt. on medications and Pt. on home beta blockers  Rhythm:Regular Rate:Normal  ECG: SB, rate 49  ECHO: - Left ventricle: The cavity size was normal. There was mild focal basal hypertrophy of the septum. Systolic function was normal. The estimated ejection fraction was in the range of 55% to 60%.   Wall motion was normal; there were no regional wall motion abnormalities. Left ventricular diastolic function parameters were normal.  Cardiologist - Nasher   Neuro/Psych negative neurological ROS  negative psych ROS   GI/Hepatic ,GERD  ,,(+)     substance abuse (former)  alcohol use  Endo/Other  negative endocrine ROS    Renal/GU negative Renal ROS     Musculoskeletal  (+) Arthritis ,    Abdominal  (+) + obese  Peds  Hematology negative hematology ROS (+)   Anesthesia Other Findings Hyperlipidemia   Reproductive/Obstetrics                              Anesthesia Physical Anesthesia Plan  ASA: 3  Anesthesia Plan: Spinal and Regional   Post-op Pain Management:  Regional for Post-op pain and Tylenol PO (pre-op)*, Celebrex PO (pre-op)* and Regional block*   Induction: Intravenous  PONV Risk Score and Plan: 1 and Ondansetron, Dexamethasone, Propofol and Midazolam  Airway Management Planned: Natural Airway  Additional Equipment: None  Intra-op Plan:   Post-operative Plan:   Informed Consent: I have reviewed the patients History  and Physical, chart, labs and discussed the procedure including the risks, benefits and alternatives for the proposed anesthesia with the patient or authorized representative who has indicated his/her understanding and acceptance.     Dental advisory given  Plan Discussed with: CRNA  Anesthesia Plan Comments: (See PAT note 03/27/2022)         Anesthesia Quick Evaluation

## 2022-04-02 ENCOUNTER — Encounter (HOSPITAL_COMMUNITY)
Admission: RE | Disposition: A | Discharge status: Home / Self Care | Payer: Self-pay | Source: Home / Self Care | Attending: Orthopedic Surgery

## 2022-04-02 ENCOUNTER — Encounter (HOSPITAL_COMMUNITY): Payer: Self-pay | Admitting: Orthopedic Surgery

## 2022-04-02 ENCOUNTER — Ambulatory Visit (HOSPITAL_COMMUNITY): Payer: BC Managed Care – PPO | Admitting: Certified Registered Nurse Anesthetist

## 2022-04-02 ENCOUNTER — Observation Stay (HOSPITAL_COMMUNITY)
Admission: RE | Admit: 2022-04-02 | Discharge: 2022-04-02 | Disposition: A | Discharge status: Home / Self Care | Payer: BC Managed Care – PPO | Attending: Orthopedic Surgery | Admitting: Orthopedic Surgery

## 2022-04-02 ENCOUNTER — Ambulatory Visit (HOSPITAL_COMMUNITY): Payer: BC Managed Care – PPO | Admitting: Physician Assistant

## 2022-04-02 ENCOUNTER — Ambulatory Visit (HOSPITAL_COMMUNITY): Payer: BC Managed Care – PPO

## 2022-04-02 ENCOUNTER — Other Ambulatory Visit: Payer: Self-pay

## 2022-04-02 DIAGNOSIS — Z01818 Encounter for other preprocedural examination: Secondary | ICD-10-CM

## 2022-04-02 DIAGNOSIS — Z7982 Long term (current) use of aspirin: Secondary | ICD-10-CM | POA: Insufficient documentation

## 2022-04-02 DIAGNOSIS — G8918 Other acute postprocedural pain: Secondary | ICD-10-CM | POA: Diagnosis not present

## 2022-04-02 DIAGNOSIS — Z87891 Personal history of nicotine dependence: Secondary | ICD-10-CM | POA: Diagnosis not present

## 2022-04-02 DIAGNOSIS — I10 Essential (primary) hypertension: Secondary | ICD-10-CM

## 2022-04-02 DIAGNOSIS — M1712 Unilateral primary osteoarthritis, left knee: Principal | ICD-10-CM | POA: Insufficient documentation

## 2022-04-02 DIAGNOSIS — Z79899 Other long term (current) drug therapy: Secondary | ICD-10-CM | POA: Diagnosis not present

## 2022-04-02 DIAGNOSIS — J449 Chronic obstructive pulmonary disease, unspecified: Secondary | ICD-10-CM | POA: Diagnosis not present

## 2022-04-02 DIAGNOSIS — Z471 Aftercare following joint replacement surgery: Secondary | ICD-10-CM | POA: Diagnosis not present

## 2022-04-02 DIAGNOSIS — Z96651 Presence of right artificial knee joint: Secondary | ICD-10-CM | POA: Insufficient documentation

## 2022-04-02 DIAGNOSIS — Z96652 Presence of left artificial knee joint: Secondary | ICD-10-CM | POA: Diagnosis not present

## 2022-04-02 HISTORY — PX: KNEE ARTHROPLASTY: SHX992

## 2022-04-02 SURGERY — ARTHROPLASTY, KNEE, TOTAL, USING IMAGELESS COMPUTER-ASSISTED NAVIGATION
Anesthesia: Regional | Site: Knee | Laterality: Left

## 2022-04-02 MED ORDER — TRANEXAMIC ACID-NACL 1000-0.7 MG/100ML-% IV SOLN
1000.0000 mg | INTRAVENOUS | Status: AC
  Administered 2022-04-02: 1000 mg via INTRAVENOUS
  Filled 2022-04-02: qty 100

## 2022-04-02 MED ORDER — PROPOFOL 10 MG/ML IV BOLUS
INTRAVENOUS | Status: DC | PRN
  Administered 2022-04-02: 30 mg via INTRAVENOUS

## 2022-04-02 MED ORDER — PROPOFOL 500 MG/50ML IV EMUL
INTRAVENOUS | Status: DC | PRN
  Administered 2022-04-02: 50 ug/kg/min via INTRAVENOUS

## 2022-04-02 MED ORDER — ACETAMINOPHEN 500 MG PO TABS
ORAL_TABLET | ORAL | Status: AC
  Filled 2022-04-02: qty 2

## 2022-04-02 MED ORDER — CEFAZOLIN SODIUM-DEXTROSE 2-4 GM/100ML-% IV SOLN
2.0000 g | INTRAVENOUS | Status: AC
  Administered 2022-04-02: 2 g via INTRAVENOUS
  Filled 2022-04-02: qty 100

## 2022-04-02 MED ORDER — KETOROLAC TROMETHAMINE 30 MG/ML IJ SOLN
INTRAMUSCULAR | Status: DC | PRN
  Administered 2022-04-02: 30 mg via INTRAMUSCULAR

## 2022-04-02 MED ORDER — ASPIRIN 81 MG PO CHEW: 81.0000 mg | CHEWABLE_TABLET | Freq: Two times a day (BID) | ORAL | 0 refills | Status: AC

## 2022-04-02 MED ORDER — LACTATED RINGERS IV BOLUS
250.0000 mL | Freq: Once | INTRAVENOUS | Status: AC
  Administered 2022-04-02: 250 mL via INTRAVENOUS

## 2022-04-02 MED ORDER — MEPERIDINE HCL 50 MG/ML IJ SOLN: 6.2500 mg | INTRAMUSCULAR | Status: DC | PRN

## 2022-04-02 MED ORDER — EPHEDRINE SULFATE-NACL 50-0.9 MG/10ML-% IV SOSY
PREFILLED_SYRINGE | INTRAVENOUS | Status: DC | PRN
  Administered 2022-04-02: 5 mg via INTRAVENOUS

## 2022-04-02 MED ORDER — ROPIVACAINE HCL 5 MG/ML IJ SOLN
INTRAMUSCULAR | Status: DC | PRN
  Administered 2022-04-02: 30 mL via PERINEURAL

## 2022-04-02 MED ORDER — OXYCODONE HCL 5 MG PO TABS
ORAL_TABLET | ORAL | Status: AC
  Filled 2022-04-02: qty 1

## 2022-04-02 MED ORDER — METHOCARBAMOL 500 MG PO TABS: 500.0000 mg | ORAL_TABLET | Freq: Four times a day (QID) | ORAL | Status: DC | PRN

## 2022-04-02 MED ORDER — BUPIVACAINE-EPINEPHRINE (PF) 0.25% -1:200000 IJ SOLN
INTRAMUSCULAR | Status: AC
  Filled 2022-04-02: qty 30

## 2022-04-02 MED ORDER — HYDROMORPHONE HCL 1 MG/ML IJ SOLN: 0.5000 mg | INTRAMUSCULAR | Status: DC | PRN

## 2022-04-02 MED ORDER — STERILE WATER FOR IRRIGATION IR SOLN
Status: DC | PRN
  Administered 2022-04-02: 2000 mL

## 2022-04-02 MED ORDER — OXYCODONE HCL 5 MG PO TABS
10.0000 mg | ORAL_TABLET | ORAL | Status: DC | PRN
  Administered 2022-04-02: 10 mg via ORAL

## 2022-04-02 MED ORDER — ISOPROPYL ALCOHOL 70 % SOLN
Status: AC
  Filled 2022-04-02: qty 480

## 2022-04-02 MED ORDER — ACETAMINOPHEN 500 MG PO TABS
1000.0000 mg | ORAL_TABLET | Freq: Once | ORAL | Status: AC
  Administered 2022-04-02: 1000 mg via ORAL
  Filled 2022-04-02: qty 2

## 2022-04-02 MED ORDER — OXYCODONE HCL 5 MG PO TABS: 5.0000 mg | ORAL_TABLET | ORAL | Status: DC | PRN

## 2022-04-02 MED ORDER — ONDANSETRON HCL 4 MG PO TABS: 4.0000 mg | ORAL_TABLET | Freq: Three times a day (TID) | ORAL | 0 refills | Status: AC | PRN

## 2022-04-02 MED ORDER — ONDANSETRON HCL 4 MG/2ML IJ SOLN
INTRAMUSCULAR | Status: DC | PRN
  Administered 2022-04-02: 4 mg via INTRAVENOUS

## 2022-04-02 MED ORDER — FENTANYL CITRATE (PF) 100 MCG/2ML IJ SOLN
INTRAMUSCULAR | Status: DC | PRN
  Administered 2022-04-02 (×2): 50 ug via INTRAVENOUS

## 2022-04-02 MED ORDER — ACETAMINOPHEN 500 MG PO TABS: 1000.0000 mg | ORAL_TABLET | Freq: Once | ORAL | Status: DC

## 2022-04-02 MED ORDER — DEXAMETHASONE SODIUM PHOSPHATE 4 MG/ML IJ SOLN
INTRAMUSCULAR | Status: DC | PRN
  Administered 2022-04-02: 5 mg via PERINEURAL

## 2022-04-02 MED ORDER — ORAL CARE MOUTH RINSE: 15.0000 mL | Freq: Once | OROMUCOSAL | Status: AC

## 2022-04-02 MED ORDER — ISOPROPYL ALCOHOL 70 % SOLN
Status: DC | PRN
  Administered 2022-04-02: 1 via TOPICAL

## 2022-04-02 MED ORDER — PHENYLEPHRINE HCL-NACL 20-0.9 MG/250ML-% IV SOLN
INTRAVENOUS | Status: DC | PRN
  Administered 2022-04-02: 35 ug/min via INTRAVENOUS

## 2022-04-02 MED ORDER — OXYCODONE HCL 5 MG PO TABS
ORAL_TABLET | ORAL | Status: AC
  Administered 2022-04-02: 5 mg via ORAL
  Filled 2022-04-02: qty 2

## 2022-04-02 MED ORDER — KETOROLAC TROMETHAMINE 30 MG/ML IJ SOLN
INTRAMUSCULAR | Status: AC
  Filled 2022-04-02: qty 1

## 2022-04-02 MED ORDER — METOCLOPRAMIDE HCL 5 MG PO TABS: 5.0000 mg | ORAL_TABLET | Freq: Three times a day (TID) | ORAL | Status: DC | PRN

## 2022-04-02 MED ORDER — POVIDONE-IODINE 10 % EX SWAB: 2.0000 | Freq: Once | CUTANEOUS | Status: AC

## 2022-04-02 MED ORDER — CEFAZOLIN SODIUM-DEXTROSE 2-4 GM/100ML-% IV SOLN: 2.0000 g | INTRAVENOUS | Status: DC

## 2022-04-02 MED ORDER — OXYCODONE HCL 5 MG PO TABS: 5.0000 mg | ORAL_TABLET | ORAL | 0 refills | Status: AC | PRN

## 2022-04-02 MED ORDER — ONDANSETRON HCL 4 MG PO TABS: 4.0000 mg | ORAL_TABLET | Freq: Four times a day (QID) | ORAL | Status: DC | PRN

## 2022-04-02 MED ORDER — LACTATED RINGERS IV BOLUS
500.0000 mL | Freq: Once | INTRAVENOUS | Status: AC
  Administered 2022-04-02: 500 mL via INTRAVENOUS

## 2022-04-02 MED ORDER — TRANEXAMIC ACID-NACL 1000-0.7 MG/100ML-% IV SOLN
1000.0000 mg | Freq: Once | INTRAVENOUS | Status: AC
  Administered 2022-04-02: 1000 mg via INTRAVENOUS

## 2022-04-02 MED ORDER — METHOCARBAMOL 500 MG IVPB - SIMPLE MED
INTRAVENOUS | Status: AC
  Filled 2022-04-02: qty 55

## 2022-04-02 MED ORDER — SODIUM CHLORIDE 0.9 % IR SOLN
Status: DC | PRN
  Administered 2022-04-02 (×2): 1000 mL

## 2022-04-02 MED ORDER — POLYETHYLENE GLYCOL 3350 17 G PO PACK: 17.0000 g | PACK | Freq: Every day | ORAL | 0 refills | Status: AC | PRN

## 2022-04-02 MED ORDER — SENNA 8.6 MG PO TABS: 2.0000 | ORAL_TABLET | Freq: Every day | ORAL | 0 refills | Status: AC

## 2022-04-02 MED ORDER — MIDAZOLAM HCL 5 MG/5ML IJ SOLN
INTRAMUSCULAR | Status: DC | PRN
  Administered 2022-04-02 (×2): 1 mg via INTRAVENOUS

## 2022-04-02 MED ORDER — METOCLOPRAMIDE HCL 5 MG/ML IJ SOLN: 5.0000 mg | Freq: Three times a day (TID) | INTRAMUSCULAR | Status: DC | PRN

## 2022-04-02 MED ORDER — LACTATED RINGERS IV SOLN: INTRAVENOUS | Status: DC

## 2022-04-02 MED ORDER — SODIUM CHLORIDE (PF) 0.9 % IJ SOLN
INTRAMUSCULAR | Status: DC | PRN
  Administered 2022-04-02: 30 mL

## 2022-04-02 MED ORDER — PROMETHAZINE HCL 25 MG/ML IJ SOLN: 6.2500 mg | INTRAMUSCULAR | Status: DC | PRN

## 2022-04-02 MED ORDER — METHOCARBAMOL 500 MG PO TABS: 500.0000 mg | ORAL_TABLET | Freq: Four times a day (QID) | ORAL | 0 refills | Status: AC | PRN

## 2022-04-02 MED ORDER — CLONIDINE HCL (ANALGESIA) 100 MCG/ML EP SOLN
EPIDURAL | Status: DC | PRN
  Administered 2022-04-02: 80 ug

## 2022-04-02 MED ORDER — CELECOXIB 200 MG PO CAPS
200.0000 mg | ORAL_CAPSULE | Freq: Once | ORAL | Status: AC
  Administered 2022-04-02: 200 mg via ORAL
  Filled 2022-04-02: qty 1

## 2022-04-02 MED ORDER — HYDROMORPHONE HCL 1 MG/ML IJ SOLN: 0.2500 mg | INTRAMUSCULAR | Status: DC | PRN

## 2022-04-02 MED ORDER — BUPIVACAINE IN DEXTROSE 0.75-8.25 % IT SOLN
INTRATHECAL | Status: DC | PRN
  Administered 2022-04-02: 2 mL via INTRATHECAL

## 2022-04-02 MED ORDER — ONDANSETRON HCL 4 MG/2ML IJ SOLN: 4.0000 mg | Freq: Four times a day (QID) | INTRAMUSCULAR | Status: DC | PRN

## 2022-04-02 MED ORDER — PROPOFOL 1000 MG/100ML IV EMUL
INTRAVENOUS | Status: AC
  Filled 2022-04-02: qty 100

## 2022-04-02 MED ORDER — CEFAZOLIN SODIUM-DEXTROSE 2-4 GM/100ML-% IV SOLN
2.0000 g | Freq: Four times a day (QID) | INTRAVENOUS | Status: DC
  Administered 2022-04-02: 2 g via INTRAVENOUS

## 2022-04-02 MED ORDER — BUPIVACAINE-EPINEPHRINE 0.25% -1:200000 IJ SOLN
INTRAMUSCULAR | Status: DC | PRN
  Administered 2022-04-02: 30 mL

## 2022-04-02 MED ORDER — CEFAZOLIN SODIUM-DEXTROSE 2-4 GM/100ML-% IV SOLN
INTRAVENOUS | Status: AC
  Filled 2022-04-02: qty 100

## 2022-04-02 MED ORDER — METHOCARBAMOL 500 MG IVPB - SIMPLE MED
500.0000 mg | Freq: Four times a day (QID) | INTRAVENOUS | Status: DC | PRN
  Administered 2022-04-02: 500 mg via INTRAVENOUS

## 2022-04-02 MED ORDER — CHLORHEXIDINE GLUCONATE 0.12 % MT SOLN
15.0000 mL | Freq: Once | OROMUCOSAL | Status: AC
  Administered 2022-04-02: 15 mL via OROMUCOSAL

## 2022-04-02 MED ORDER — POVIDONE-IODINE 10 % EX SWAB
2.0000 | Freq: Once | CUTANEOUS | Status: AC
  Administered 2022-04-02: 2 via TOPICAL

## 2022-04-02 MED ORDER — OXYCODONE HCL 5 MG PO TABS
ORAL_TABLET | ORAL | Status: AC
  Administered 2022-04-02: 10 mg via ORAL
  Filled 2022-04-02: qty 2

## 2022-04-02 MED ORDER — FENTANYL CITRATE (PF) 100 MCG/2ML IJ SOLN
INTRAMUSCULAR | Status: AC
  Filled 2022-04-02: qty 2

## 2022-04-02 MED ORDER — TRANEXAMIC ACID-NACL 1000-0.7 MG/100ML-% IV SOLN
INTRAVENOUS | Status: AC
  Filled 2022-04-02: qty 100

## 2022-04-02 MED ORDER — ONDANSETRON HCL 4 MG/2ML IJ SOLN
INTRAMUSCULAR | Status: AC
  Filled 2022-04-02: qty 2

## 2022-04-02 MED ORDER — ACETAMINOPHEN 325 MG PO TABS: 325.0000 mg | ORAL_TABLET | Freq: Four times a day (QID) | ORAL | Status: DC | PRN

## 2022-04-02 MED ORDER — SODIUM CHLORIDE (PF) 0.9 % IJ SOLN
INTRAMUSCULAR | Status: AC
  Filled 2022-04-02: qty 50

## 2022-04-02 MED ORDER — TRANEXAMIC ACID-NACL 1000-0.7 MG/100ML-% IV SOLN: 1000.0000 mg | INTRAVENOUS | Status: DC

## 2022-04-02 MED ORDER — MIDAZOLAM HCL 2 MG/2ML IJ SOLN
INTRAMUSCULAR | Status: AC
  Filled 2022-04-02: qty 2

## 2022-04-02 MED ORDER — EPHEDRINE 5 MG/ML INJ
INTRAVENOUS | Status: AC
  Filled 2022-04-02: qty 5

## 2022-04-02 MED ORDER — DOCUSATE SODIUM 100 MG PO CAPS: 100.0000 mg | ORAL_CAPSULE | Freq: Two times a day (BID) | ORAL | 0 refills | Status: AC

## 2022-04-02 MED ORDER — ACETAMINOPHEN 500 MG PO TABS
1000.0000 mg | ORAL_TABLET | Freq: Four times a day (QID) | ORAL | Status: DC
  Administered 2022-04-02: 1000 mg via ORAL

## 2022-04-02 SURGICAL SUPPLY — 67 items
ADH SKN CLS APL DERMABOND .7 (GAUZE/BANDAGES/DRESSINGS) ×2
APL PRP STRL LF DISP 70% ISPRP (MISCELLANEOUS) ×2
BAG COUNTER SPONGE SURGICOUNT (BAG) IMPLANT
BAG SPNG CNTER NS LX DISP (BAG) ×1
BATTERY INSTRU NAVIGATION (MISCELLANEOUS) ×3 IMPLANT
BLADE SAW RECIPROCATING 77.5 (BLADE) ×1 IMPLANT
BNDG ELASTIC 4X5.8 VLCR STR LF (GAUZE/BANDAGES/DRESSINGS) ×1 IMPLANT
BNDG ELASTIC 6X5.8 VLCR STR LF (GAUZE/BANDAGES/DRESSINGS) ×1 IMPLANT
BTRY SRG DRVR LF (MISCELLANEOUS) ×3
CHLORAPREP W/TINT 26 (MISCELLANEOUS) ×2 IMPLANT
COMP FEM PS STD 11 LT (Joint) ×1 IMPLANT
COMP PATELLA POR NG 41X10 (Knees) ×1 IMPLANT
COMP TIB KNEE H 0D LT (Joint) ×1 IMPLANT
COMPONENT FEM PS STD 11 LT (Joint) IMPLANT
COMPONENT PATELLA POR NG 41X10 (Knees) IMPLANT
COMPONENT TIB KNEE H 0D LT (Joint) IMPLANT
COVER SURGICAL LIGHT HANDLE (MISCELLANEOUS) ×1 IMPLANT
DERMABOND ADVANCED .7 DNX12 (GAUZE/BANDAGES/DRESSINGS) ×2 IMPLANT
DRAPE INCISE IOBAN 66X45 STRL (DRAPES) ×1 IMPLANT
DRAPE SHEET LG 3/4 BI-LAMINATE (DRAPES) ×3 IMPLANT
DRAPE U-SHAPE 47X51 STRL (DRAPES) ×1 IMPLANT
DRSG AQUACEL AG ADV 3.5X10 (GAUZE/BANDAGES/DRESSINGS) ×1 IMPLANT
ELECT BLADE TIP CTD 4 INCH (ELECTRODE) ×1 IMPLANT
ELECT REM PT RETURN 15FT ADLT (MISCELLANEOUS) ×1 IMPLANT
GAUZE SPONGE 4X4 12PLY STRL (GAUZE/BANDAGES/DRESSINGS) ×1 IMPLANT
GLOVE BIO SURGEON STRL SZ7 (GLOVE) ×1 IMPLANT
GLOVE BIO SURGEON STRL SZ8.5 (GLOVE) ×2 IMPLANT
GLOVE BIOGEL PI IND STRL 7.5 (GLOVE) ×1 IMPLANT
GLOVE BIOGEL PI IND STRL 8.5 (GLOVE) ×1 IMPLANT
GOWN SPEC L3 XXLG W/TWL (GOWN DISPOSABLE) ×1 IMPLANT
GOWN STRL REUS W/ TWL XL LVL3 (GOWN DISPOSABLE) ×1 IMPLANT
GOWN STRL REUS W/TWL XL LVL3 (GOWN DISPOSABLE) ×1
HANDPIECE INTERPULSE COAX TIP (DISPOSABLE) ×1
HDLS TROCR DRIL PIN KNEE 75 (PIN) ×2
HOLDER FOLEY CATH W/STRAP (MISCELLANEOUS) ×1 IMPLANT
HOOD PEEL AWAY T7 (MISCELLANEOUS) ×3 IMPLANT
KIT TURNOVER KIT A (KITS) IMPLANT
LINER TIB FXD GH/7-12 214 LT (Liner) IMPLANT
MARKER SKIN DUAL TIP RULER LAB (MISCELLANEOUS) ×1 IMPLANT
NDL SAFETY ECLIP 18X1.5 (MISCELLANEOUS) ×1 IMPLANT
NDL SPNL 18GX3.5 QUINCKE PK (NEEDLE) ×1 IMPLANT
NEEDLE SPNL 18GX3.5 QUINCKE PK (NEEDLE) ×1 IMPLANT
NS IRRIG 1000ML POUR BTL (IV SOLUTION) ×1 IMPLANT
PACK TOTAL KNEE CUSTOM (KITS) ×1 IMPLANT
PADDING CAST COTTON 6X4 STRL (CAST SUPPLIES) ×1 IMPLANT
PIN DRILL HDLS TROCAR 75 4PK (PIN) IMPLANT
PROTECTOR NERVE ULNAR (MISCELLANEOUS) ×1 IMPLANT
SAW OSC TIP CART 19.5X105X1.3 (SAW) ×1 IMPLANT
SCREW FEMALE HEX FIX 25X2.5 (ORTHOPEDIC DISPOSABLE SUPPLIES) IMPLANT
SEALER BIPOLAR AQUA 6.0 (INSTRUMENTS) ×1 IMPLANT
SET HNDPC FAN SPRY TIP SCT (DISPOSABLE) ×1 IMPLANT
SET PAD KNEE POSITIONER (MISCELLANEOUS) ×1 IMPLANT
SOLUTION PRONTOSAN WOUND 350ML (IRRIGATION / IRRIGATOR) IMPLANT
SUT MNCRL AB 3-0 PS2 18 (SUTURE) ×1 IMPLANT
SUT MNCRL AB 4-0 PS2 18 (SUTURE) IMPLANT
SUT MON AB 2-0 CT1 36 (SUTURE) ×1 IMPLANT
SUT STRATAFIX PDO 1 14 VIOLET (SUTURE) ×1
SUT STRATFX PDO 1 14 VIOLET (SUTURE) ×1
SUT VIC AB 1 CTX 36 (SUTURE) ×2
SUT VIC AB 1 CTX36XBRD ANBCTR (SUTURE) ×2 IMPLANT
SUT VIC AB 2-0 CT1 27 (SUTURE)
SUT VIC AB 2-0 CT1 TAPERPNT 27 (SUTURE) IMPLANT
SUTURE STRATFX PDO 1 14 VIOLET (SUTURE) ×1 IMPLANT
TRAY FOLEY MTR SLVR 16FR STAT (SET/KITS/TRAYS/PACK) IMPLANT
TUBE SUCTION HIGH CAP CLEAR NV (SUCTIONS) ×1 IMPLANT
WATER STERILE IRR 1000ML POUR (IV SOLUTION) ×2 IMPLANT
WRAP KNEE MAXI GEL POST OP (GAUZE/BANDAGES/DRESSINGS) IMPLANT

## 2022-04-02 NOTE — Anesthesia Procedure Notes (Addendum)
Spinal  Patient location during procedure: OR Start time: 04/02/2022 8:33 AM End time: 04/02/2022 8:43 AM Reason for block: surgical anesthesia Staffing Performed: anesthesiologist  Anesthesiologist: Nolon Nations, MD Performed by: Nolon Nations, MD Authorized by: Nolon Nations, MD   Preanesthetic Checklist Completed: patient identified, IV checked, site marked, risks and benefits discussed, surgical consent, monitors and equipment checked, pre-op evaluation and timeout performed Spinal Block Patient position: sitting Prep: DuraPrep and site prepped and draped Patient monitoring: heart rate, continuous pulse ox and blood pressure Approach: midline Location: L2-3 Injection technique: single-shot Needle Needle type: Spinocan  Needle gauge: 25 G Needle length: 9 cm Assessment Events: CSF return and second provider Additional Notes Expiration date of kit checked and confirmed. Patient tolerated procedure well, without complications.

## 2022-04-02 NOTE — Interval H&P Note (Signed)
History and Physical Interval Note:  04/02/2022 8:10 AM  Charles Leblanc  has presented today for surgery, with the diagnosis of Left knee osteoarthritis.  The various methods of treatment have been discussed with the patient and family. After consideration of risks, benefits and other options for treatment, the patient has consented to  Procedure(s) with comments: COMPUTER ASSISTED TOTAL KNEE ARTHROPLASTY (Left) - 150 as a surgical intervention.  The patient's history has been reviewed, patient examined, no change in status, stable for surgery.  I have reviewed the patient's chart and labs.  Questions were answered to the patient's satisfaction.     [name]

## 2022-04-02 NOTE — Discharge Instructions (Signed)
 Dr. Brian Swinteck Total Joint Specialist [facility] [address] [place], [place] [address] [phone]  TOTAL KNEE REPLACEMENT POSTOPERATIVE DIRECTIONS    Knee Rehabilitation, Guidelines Following Surgery  Results after knee surgery are often greatly improved when you follow the exercise, range of motion and muscle strengthening exercises prescribed by your doctor. Safety measures are also important to protect the knee from further injury. Any time any of these exercises cause you to have increased pain or swelling in your knee joint, decrease the amount until you are comfortable again and slowly increase them. If you have problems or questions, call your caregiver or physical therapist for advice.   WEIGHT BEARING Weight bearing as tolerated with assist device (walker, cane, etc) as directed, use it as long as suggested by your surgeon or therapist, typically at least 4-6 weeks.  HOME CARE INSTRUCTIONS  Remove items at home which could result in a fall. This includes throw rugs or furniture in walking pathways.  Continue medications as instructed at time of discharge. You may have some home medications which will be placed on hold until you complete the course of blood thinner medication.  You may start showering once you are discharged home but do not submerge the incision under water. Just pat the incision dry and apply a dry gauze dressing on daily. Walk with walker as instructed.  You may resume a sexual relationship in one month or when given the OK by your doctor.  Use walker as long as suggested by your caregivers. Avoid periods of inactivity such as sitting longer than an hour when not asleep. This helps prevent blood clots.  You may put full weight on your legs and walk as much as is comfortable.  You may return to work once you are cleared by your doctor.  Do not drive a car for 6 weeks or until released by you surgeon.  Do not drive while  taking narcotics.  Wear the elastic stockings for three weeks following surgery during the day but you may remove then at night. Make sure you keep all of your appointments after your operation with all of your doctors and caregivers. You should call the office at the above phone number and make an appointment for approximately two weeks after the date of your surgery. Do not remove your surgical dressing. The dressing is waterproof; you may take showers in 3 days, but do not take tub baths or submerge the dressing. Please pick up a stool softener and laxative for home use as long as you are requiring pain medications. ICE to the affected knee every three hours for 30 minutes at a time and then as needed for pain and swelling.  Continue to use ice on the knee for pain and swelling from surgery. You may notice swelling that will progress down to the foot and ankle.  This is normal after surgery.  Elevate the leg when you are not up walking on it.   It is important for you to complete the blood thinner medication as prescribed by your doctor. Continue to use the breathing machine which will help keep your temperature down.  It is common for your temperature to cycle up and down following surgery, especially at night when you are not up moving around and exerting yourself.  The breathing machine keeps your lungs expanded and your temperature down.  RANGE OF MOTION AND STRENGTHENING EXERCISES  Rehabilitation of the knee is important following a knee injury or an   operation. After just a few days of immobilization, the muscles of the thigh which control the knee become weakened and shrink (atrophy). Knee exercises are designed to build up the tone and strength of the thigh muscles and to improve knee motion. Often times heat used for twenty to thirty minutes before working out will loosen up your tissues and help with improving the range of motion but do not use heat for the first two weeks following surgery.  These exercises can be done on a training (exercise) mat, on the floor, on a table or on a bed. Use what ever works the best and is most comfortable for you Knee exercises include:  Leg Lifts - While your knee is still immobilized in a splint or cast, you can do straight leg raises. Lift the leg to 60 degrees, hold for 3 sec, and slowly lower the leg. Repeat 10-20 times 2-3 times daily. Perform this exercise against resistance later as your knee gets better.  Quad and Hamstring Sets - Tighten up the muscle on the front of the thigh (Quad) and hold for 5-10 sec. Repeat this 10-20 times hourly. Hamstring sets are done by pushing the foot backward against an object and holding for 5-10 sec. Repeat as with quad sets.  A rehabilitation program following serious knee injuries can speed recovery and prevent re-injury in the future due to weakened muscles. Contact your doctor or a physical therapist for more information on knee rehabilitation.   POST-OPERATIVE OPIOID TAPER INSTRUCTIONS: It is important to wean off of your opioid medication as soon as possible. If you do not need pain medication after your surgery it is ok to stop day one. Opioids include: Codeine, Hydrocodone(Norco, Vicodin), Oxycodone(Percocet, oxycontin) and hydromorphone amongst others.  Long term and even short term use of opiods can cause: Increased pain response Dependence Constipation Depression Respiratory depression And more.  Withdrawal symptoms can include Flu like symptoms Nausea, vomiting And more Techniques to manage these symptoms Hydrate well Eat regular healthy meals Stay active Use relaxation techniques(deep breathing, meditating, yoga) Do Not substitute Alcohol to help with tapering If you have been on opioids for less than two weeks and do not have pain than it is ok to stop all together.  Plan to wean off of opioids This plan should start within one week post op of your joint replacement. Maintain the same  interval or time between taking each dose and first decrease the dose.  Cut the total daily intake of opioids by one tablet each day Next start to increase the time between doses. The last dose that should be eliminated is the evening dose.    SKILLED REHAB INSTRUCTIONS: If the patient is transferred to a skilled rehab facility following release from the hospital, a list of the current medications will be sent to the facility for the patient to continue.  When discharged from the skilled rehab facility, please have the facility set up the patient's Home Health Physical Therapy prior to being released. Also, the skilled facility will be responsible for providing the patient with their medications at time of release from the facility to include their pain medication, the muscle relaxants, and their blood thinner medication. If the patient is still at the rehab facility at time of the two week follow up appointment, the skilled rehab facility will also need to assist the patient in arranging follow up appointment in our office and any transportation needs.  MAKE SURE YOU:  Understand these instructions.  Will watch   your condition.  Will get help right away if you are not doing well or get worse.    Pick up stool softner and laxative for home use following surgery while on pain medications. Do NOT remove your dressing. You may shower.  Do not take tub baths or submerge incision under water. May shower starting three days after surgery. Please use a clean towel to pat the incision dry following showers. Continue to use ice for pain and swelling after surgery. Do not use any lotions or creams on the incision until instructed by your surgeon.  

## 2022-04-02 NOTE — Anesthesia Postprocedure Evaluation (Signed)
Anesthesia Post Note  Patient: Charles Leblanc  Procedure(s) Performed: COMPUTER ASSISTED TOTAL KNEE ARTHROPLASTY (Left: Knee)     Patient location during evaluation: PACU Anesthesia Type: Spinal Level of consciousness: sedated and patient cooperative Pain management: pain level controlled Vital Signs Assessment: post-procedure vital signs reviewed and stable Respiratory status: spontaneous breathing Cardiovascular status: stable Anesthetic complications: no   No notable events documented.  Last Vitals:  Vitals:   04/02/22 1340 04/02/22 1445  BP:  126/78  Pulse: (!) 56 (!) 49  Resp:    Temp:  (!) 36.1 C  SpO2: 99% 98%    Last Pain:  Vitals:   04/02/22 1340  TempSrc:   PainSc: 8                  Jaziyah Gradel [employer]

## 2022-04-02 NOTE — Op Note (Signed)
OPERATIVE REPORT  SURGEON: Rod Can, MD   ASSISTANT: Larene Pickett, PA-C  PREOPERATIVE DIAGNOSIS: Primary Left knee arthritis.   POSTOPERATIVE DIAGNOSIS: Primary Left knee arthritis.   PROCEDURE: Computer assisted Left total knee arthroplasty.   IMPLANTS: Zimmer Persona PPS Cementless CR femur, size 11. Persona 0 degree Spiked Keel OsseoTi Tibia, size H. Vivacit-E polyethelyene insert, size 14 mm, CR. TM standard patella, size 41 mm.  ANESTHESIA:  MAC, Regional, and Spinal  TOURNIQUET TIME: Not utilized.   ESTIMATED BLOOD LOSS:-100 mL    ANTIBIOTICS: 2g Ancef.  DRAINS: None.  COMPLICATIONS: None   CONDITION: PACU - hemodynamically stable.   BRIEF CLINICAL NOTE: Charles Leblanc is a 61 y.o. male with a long-standing history of Left knee arthritis. After failing conservative management, the patient was indicated for total knee arthroplasty. The risks, benefits, and alternatives to the procedure were explained, and the patient elected to proceed.  PROCEDURE IN DETAIL: Adductor canal block was obtained in the pre-op holding area. Once inside the operative room, spinal anesthesia was obtained, and a foley catheter was inserted. The patient was then positioned and the lower extremity was prepped and draped in the normal sterile surgical fashion.  A time-out was called verifying side and site of surgery. The patient received IV antibiotics within 60 minutes of beginning the procedure. A tourniquet was not utilized.   An anterior approach to the knee was performed utilizing a midvastus arthrotomy. A medial release was performed and the patellar fat pad was excised. Stryker imageless navigation was used to cut the distal femur perpendicular to the mechanical axis. A freehand patellar resection was performed, and the patella was sized an prepared with 3 lug holes.  Nagivation was used to make a neutral proximal tibia resection, taking 9 mm of bone from the less affected lateral side  with 3 degrees of slope. The menisci were excised. A spacer block was placed, and the alignment and balance in extension were confirmed.   The distal femur was sized using the 3-degree external rotation guide referencing the posterior femoral cortex. The appropriate 4-in-1 cutting block was pinned into place. Rotation was checked using Whiteside's line, the epicondylar axis, and then confirmed with a spacer block in flexion. The remaining femoral cuts were performed, taking care to protect the MCL.  The tibia was sized and the trial tray was pinned into place. The remaining trail components were inserted. The knee was stable to varus and valgus stress through a full range of motion. The patella tracked centrally, and the PCL was well balanced. The trial components were removed, and the proximal tibial surface was prepared. Final components were impacted into place. The knee was tested for a final time and found to be well balanced.   The wound was copiously irrigated with Prontosan solution and normal saline using pule lavage.  Marcaine solution was injected into the periarticular soft tissue.  The wound was closed in layers using #1 Vicryl and Stratafix for the fascia, 2-0 Vicryl for the subcutaneous fat, 2-0 Monocryl for the deep dermal layer, 3-0 running Monocryl subcuticular Stitch, and 4-0 Monocryl stay sutures at both ends of the wound. Dermabond was applied to the skin.  Once the glue was fully dried, an Aquacell Ag and compressive dressing were applied.  The patient was transported to the recovery room in stable condition.  Sponge, needle, and instrument counts were correct at the end of the case x2.  The patient tolerated the procedure well and there were no known complications.  Please note that a surgical assistant was a medical necessity for this procedure in order to perform it in a safe and expeditious manner. Surgical assistant was necessary to retract the ligaments and vital neurovascular  structures to prevent injury to them and also necessary for proper positioning of the limb to allow for anatomic placement of the prosthesis.

## 2022-04-02 NOTE — Evaluation (Signed)
Physical Therapy Evaluation Patient Details Name: Charles Leblanc MRN: [id number] DOB: Feb 20, 1961 Today's Date: 04/02/2022  History of Present Illness  Pt is a 61yo male presenting s/p L-TKA on 04/02/22. PMH: COPD, GERD, HLD, HTN, OSA, R-TKAx2 (2010 and 2018).  Clinical Impression  Charles Leblanc is a 61 y.o. male POD 0 s/p L-TKA. Patient reports modified independence using SPC with mobility at baseline. Patient is now limited by functional impairments (see PT problem list below) and requires min guard for transfers and gait with RW. Patient was able to ambulate 60 feet with RW and min guard and cues for safe walker management. Patient educated on safe sequencing for stair mobility and verbalized safe guarding position for people assisting with mobility. Patient instructed in exercises to facilitate ROM and circulation. Patient will benefit from continued skilled PT interventions to address impairments and progress towards PLOF. Patient has met mobility goals at adequate level for discharge home; will continue to follow if pt continues acute stay to progress towards Mod I goals.       Recommendations for follow up therapy are one component of a multi-disciplinary discharge planning process, led by the attending physician.  Recommendations may be updated based on patient status, additional functional criteria and insurance authorization.  Follow Up Recommendations Follow physician's recommendations for discharge plan and follow up therapies      Assistance Recommended at Discharge Frequent or constant Supervision/Assistance  Patient can return home with the following  A little help with walking and/or transfers;A little help with bathing/dressing/bathroom;Assistance with cooking/housework;Assist for transportation;Help with stairs or ramp for entrance    Equipment Recommendations Rolling walker (2 wheels)  Recommendations for Other Services       Functional Status Assessment Patient has had a  recent decline in their functional status and demonstrates the ability to make significant improvements in function in a reasonable and predictable amount of time.     Precautions / Restrictions Precautions Precautions: Fall;Knee Precaution Booklet Issued: No Precaution Comments: no pillow under th knee Restrictions Weight Bearing Restrictions: No Other Position/Activity Restrictions: wbat      Mobility  Bed Mobility Overal bed mobility: Needs Assistance Bed Mobility: Supine to Sit     Supine to sit: Supervision, HOB elevated     General bed mobility comments: For safety only    Transfers Overall transfer level: Needs assistance Equipment used: Rolling walker (2 wheels) Transfers: Sit to/from Stand Sit to Stand: Min guard, From elevated surface           General transfer comment: Min guard from stretcher, no physical assist required or overt LOB noted. VCs for hand placement.    Ambulation/Gait Ambulation/Gait assistance: Min guard Gait Distance (Feet): 60 Feet Assistive device: Rolling walker (2 wheels) Gait Pattern/deviations: Step-to pattern Gait velocity: decreased     General Gait Details: Pt ambulated with RW and min guard, no physical assist required or overt LOB noted.  Stairs Stairs: Yes Stairs assistance: Min assist Stair Management: No rails, Step to pattern, Backwards, With walker Number of Stairs: 2 General stair comments: Provided handout and education on stair mobility backwards with RW, pt verbalized understanding. Pt completed mobility task with min assist for steadying of RW only, no overt LOB noted, VCs for sequencing.  Wheelchair Mobility    Modified Rankin (Stroke Patients Only)       Balance Overall balance assessment: Needs assistance Sitting-balance support: Feet supported, No upper extremity supported Sitting balance-Leahy Scale: Good     Standing balance support: Reliant on  assistive device for balance, During functional  activity, Bilateral upper extremity supported Standing balance-Leahy Scale: Poor                               Pertinent Vitals/Pain Pain Assessment Pain Assessment: 0-10 Pain Score: 5  Pain Location: left knee Pain Descriptors / Indicators: Operative site guarding, Grimacing, Discomfort, Crying Pain Intervention(s): Limited activity within patient's tolerance, Monitored during session, Ice applied, Repositioned    Home Living Family/patient expects to be discharged to:: Private residence Living Arrangements: Spouse/significant other Available Help at Discharge: Family;Available 24 hours/day Type of Home: House Home Access: Stairs to enter Entrance Stairs-Rails: Left Entrance Stairs-Number of Steps: 3   Home Layout: One level Home Equipment: Cane - single point      Prior Function Prior Level of Function : Independent/Modified Independent;Working/employed;Driving             Mobility Comments: SPC ADLs Comments: IND     Hand Dominance   Dominant Hand: Right    Extremity/Trunk Assessment   Upper Extremity Assessment Upper Extremity Assessment: Overall WFL for tasks assessed    Lower Extremity Assessment Lower Extremity Assessment: RLE deficits/detail;LLE deficits/detail RLE Deficits / Details: MMT ank DF/PF 4/5 RLE Sensation: WNL LLE Deficits / Details: MMT ank DF/PF 3+/5, no extensor lag noted LLE Sensation: decreased light touch    Cervical / Trunk Assessment Cervical / Trunk Assessment: Normal  Communication   Communication: No difficulties  Cognition Arousal/Alertness: Awake/alert Behavior During Therapy: WFL for tasks assessed/performed Overall Cognitive Status: Within Functional Limits for tasks assessed                                          General Comments      Exercises Total Joint Exercises Ankle Circles/Pumps: AROM, Both, 10 reps Quad Sets: AROM, Left, Other reps (comment) (2) Towel Squeeze: AROM, Both,  Other reps (comment) (2) Short Arc Quad: AROM, Left, Other reps (comment) (2) Heel Slides: AROM, Left, Other reps (comment) (2) Hip ABduction/ADduction: AROM, Left, Other reps (comment) (3) Straight Leg Raises: AROM, Left, Other reps (comment) (3) Goniometric ROM: -5-90deg by gross visual approximation   Assessment/Plan    PT Assessment Patient needs continued PT services  PT Problem List Decreased strength;Decreased range of motion;Decreased activity tolerance;Decreased balance;Decreased mobility;Decreased coordination;Pain       PT Treatment Interventions DME instruction;Gait training;Stair training;Functional mobility training;Therapeutic activities;Therapeutic exercise;Balance training;Neuromuscular re-education;Patient/family education    PT Goals (Current goals can be found in the Care Plan section)  Acute Rehab PT Goals Patient Stated Goal: Ride Horses PT Goal Formulation: With patient Time For Goal Achievement: 04/09/22 Potential to Achieve Goals: Good    Frequency 7X/week     Co-evaluation               AM-PAC PT "6 Clicks" Mobility  Outcome Measure Help needed turning from your back to your side while in a flat bed without using bedrails?: None Help needed moving from lying on your back to sitting on the side of a flat bed without using bedrails?: A Little Help needed moving to and from a bed to a chair (including a wheelchair)?: A Little Help needed standing up from a chair using your arms (e.g., wheelchair or bedside chair)?: A Little Help needed to walk in hospital room?: A Little Help needed climbing 3-5 steps with  a railing? : A Little 6 Click Score: 19    End of Session Equipment Utilized During Treatment: Gait belt Activity Tolerance: Patient tolerated treatment well;No increased pain Patient left: in chair;with call bell/phone within reach Nurse Communication: Mobility status PT Visit Diagnosis: Pain;Difficulty in walking, not elsewhere classified  (R26.2) Pain - Right/Left: Left Pain - part of body: Knee    Time: 0721-8288 PT Time Calculation (min) (ACUTE ONLY): 35 min   Charges:   PT Evaluation $PT Eval Low Complexity: 1 Low PT Treatments $Gait Training: 8-22 mins        Coolidge Breeze, PT, DPT [facility] Department Office: [phone] Weekend pager: [phone]  Coolidge Breeze 04/02/2022, 2:53 PM

## 2022-04-02 NOTE — Transfer of Care (Signed)
Immediate Anesthesia Transfer of Care Note  Patient: Charles Leblanc  Procedure(s) Performed: COMPUTER ASSISTED TOTAL KNEE ARTHROPLASTY (Left: Knee)  Patient Location: PACU  Anesthesia Type:MAC  Level of Consciousness: awake, alert , and oriented  Airway & Oxygen Therapy: Patient Spontanous Breathing and Patient connected to face mask oxygen  Post-op Assessment: Report given to RN and Post -op Vital signs reviewed and stable  Post vital signs: Reviewed and stable  Last Vitals:  Vitals Value Taken Time  BP    Temp    Pulse 57 04/02/22 1105  Resp 16 04/02/22 1105  SpO2 96 % 04/02/22 1105  Vitals shown include unvalidated device data.  Last Pain:  Vitals:   04/02/22 0627  TempSrc:   PainSc: 6       Patients Stated Pain Goal: 5 (81/85/63 1497)  Complications: No notable events documented.

## 2022-04-02 NOTE — Anesthesia Procedure Notes (Signed)
Anesthesia Regional Block: Adductor canal block   Pre-Anesthetic Checklist: , timeout performed,  Correct Patient, Correct Site, Correct Laterality,  Correct Procedure, Correct Position, site marked,  Risks and benefits discussed,  Surgical consent,  Pre-op evaluation,  At surgeon's request and post-op pain management  Laterality: Lower and Left  Prep: chloraprep       Needles:  Injection technique: Single-shot  Needle Type: Stimiplex     Needle Length: 9cm  Needle Gauge: 21     Additional Needles:   Procedures:,,,, ultrasound used (permanent image in chart),,    Narrative:  Start time: 04/02/2022 7:38 AM End time: 04/02/2022 7:58 AM Injection made incrementally with aspirations every 5 mL.  Performed by: Personally  Anesthesiologist: Lewie Loron, MD  Additional Notes: BP cuff, EKG monitors applied. Sedation begun. Artery and nerve location verified with ultrasound. Anesthetic injected incrementally (68ml), slowly, and after negative aspirations under direct u/s guidance. Good fascial/perineural spread. Tolerated well.

## 2022-04-03 ENCOUNTER — Encounter (HOSPITAL_COMMUNITY): Payer: Self-pay | Admitting: Orthopedic Surgery

## 2022-04-07 ENCOUNTER — Ambulatory Visit: Payer: BC Managed Care – PPO | Attending: Student | Admitting: Physical Therapy

## 2022-04-07 DIAGNOSIS — R293 Abnormal posture: Secondary | ICD-10-CM | POA: Diagnosis not present

## 2022-04-07 DIAGNOSIS — R278 Other lack of coordination: Secondary | ICD-10-CM | POA: Insufficient documentation

## 2022-04-07 DIAGNOSIS — M6281 Muscle weakness (generalized): Secondary | ICD-10-CM | POA: Diagnosis not present

## 2022-04-07 DIAGNOSIS — R600 Localized edema: Secondary | ICD-10-CM | POA: Diagnosis not present

## 2022-04-07 DIAGNOSIS — Z96652 Presence of left artificial knee joint: Secondary | ICD-10-CM | POA: Diagnosis not present

## 2022-04-07 DIAGNOSIS — R262 Difficulty in walking, not elsewhere classified: Secondary | ICD-10-CM | POA: Insufficient documentation

## 2022-04-07 DIAGNOSIS — R2681 Unsteadiness on feet: Secondary | ICD-10-CM | POA: Insufficient documentation

## 2022-04-07 NOTE — Discharge Summary (Signed)
Physician Discharge Summary  Patient ID: Charles Leblanc MRN: [id number] DOB/AGE: 1960-12-25 61 y.o.  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 male who was admitted 04/02/2022 with a diagnosis of S/P total knee arthroplasty, left and went to the operating room on 04/02/2022 and underwent the above named procedures.    He was given perioperative antibiotics:  Anti-infectives (From admission, onward)    Start     Dose/Rate Route Frequency Ordered Stop   04/02/22 1617  ceFAZolin (ANCEF) 2-4 GM/100ML-% IVPB       Note to Pharmacy: Peri Maris: cabinet override      04/02/22 1617 04/02/22 1716   04/02/22 1430  ceFAZolin (ANCEF) IVPB 2g/100 mL premix  Status:  Discontinued        2 g 200 mL/hr over 30 Minutes Intravenous Every 6 hours 04/02/22 1204 04/02/22 2255   04/02/22 0615  ceFAZolin (ANCEF) IVPB 2g/100 mL premix        2 g 200 mL/hr over 30 Minutes Intravenous On call to O.R. 04/02/22 [id number] 04/02/22 0845   04/02/22 0615  ceFAZolin (ANCEF) IVPB 2g/100 mL premix  Status:  Discontinued        2 g 200 mL/hr over 30 Minutes Intravenous On call to O.R. 04/02/22 [id number] 04/02/22 0610       He was given sequential compression devices, early ambulation, and aspirin for DVT prophylaxis.  He ambulated 60 feet with PT. He was discharged home with OPPT same day.   There were no complications.    Recent  vital signs:  Vitals:   04/02/22 1340 04/02/22 1445  BP:  126/78  Pulse: (!) 56 (!) 49  Resp:    Temp:  (!) 97 F (36.1 C)  SpO2: 99% 98%    Recent laboratory studies:  Lab Results  Component Value Date   HGB 15.8 03/27/2022   HGB 15.6 12/08/2021   HGB 12.6 (L) 11/18/2016   Lab Results  Component Value Date   WBC 7.9 03/27/2022   PLT 269 03/27/2022   Lab Results  Component Value Date   INR 0.96 09/27/2015   Lab Results  Component Value Date   NA 142 03/27/2022   K 4.9 03/27/2022   CL 105 03/27/2022   CO2 29 03/27/2022   BUN 10 03/27/2022   CREATININE 1.14 03/27/2022   GLUCOSE 98 03/27/2022     Allergies as of 04/02/2022       Reactions   Lipitor [atorvastatin] Other (See Comments)   Migraines, Myalgia        Medication List     STOP taking these medications    ibuprofen 200 MG tablet Commonly known as: ADVIL       TAKE these medications    albuterol 108 (90 Base) MCG/ACT inhaler Commonly known as: VENTOLIN HFA Inhale 1-2 puffs into the lungs every 6 (six) hours as needed for wheezing or  shortness of breath.   aspirin 81 MG chewable tablet Commonly known as: Aspirin Childrens Chew 1 tablet (81 mg total) by mouth 2 (two) times daily with a meal.   Breztri Aerosphere 160-9-4.8 MCG/ACT Aero Generic drug: Budeson-Glycopyrrol-Formoterol Inhale 2 puffs into the lungs 2 (two) times daily as needed (shortness of breath).   carvedilol 25 MG tablet Commonly known as: COREG Take 25 mg by mouth 2 (two) times daily with a meal.   docusate sodium 100 MG capsule Commonly known as: Colace Take 1 capsule (100 mg total) by mouth 2 (two) times daily.   hydrOXYzine 25 MG tablet Commonly known as: ATARAX Take 25 mg by mouth at bedtime as needed (sleep).   methocarbamol 500 MG tablet Commonly known as: ROBAXIN Take 1 tablet (500 mg total) by mouth every 6 (six) hours as needed for muscle spasms.   NON FORMULARY at bedtime. CPAP set on #10    ondansetron 4 MG tablet Commonly known as: Zofran Take 1 tablet (4 mg total) by mouth every 8 (eight) hours as needed for nausea or vomiting.   oxyCODONE 5 MG immediate release tablet Commonly known as: Roxicodone Take 1 tablet (5 mg total) by mouth every 4 (four) hours as needed for severe pain.   polyethylene glycol 17 g packet Commonly known as: MiraLax Take 17 g by mouth daily as needed for mild constipation or moderate constipation.   rosuvastatin 5 MG tablet Commonly known as: CRESTOR Take 5 mg by mouth at bedtime.   senna 8.6 MG Tabs tablet Commonly known as: SENOKOT Take 2 tablets (17.2 mg total) by mouth at bedtime for 15 days.               Discharge Care Instructions  (From admission, onward)           Start     Ordered   04/02/22 0000  Weight bearing as tolerated        04/02/22 0741   04/02/22 0000  Change dressing       Comments: Do not remove your dressing.   04/02/22 0741              WEIGHT BEARING   Weight bearing as tolerated with assist device (walker, cane, etc) as directed, use it as long as suggested by your surgeon or therapist, typically at least 4-6 weeks.   EXERCISES  Results after joint replacement surgery are often greatly improved when you follow the exercise, range of motion and muscle strengthening exercises prescribed by your doctor. Safety measures are also important to protect the joint from further injury. Any time any of these exercises cause you to have increased pain or swelling, decrease what you are doing until you are comfortable again and then slowly increase them. If you have problems or questions, call your caregiver or physical therapist for advice.   Rehabilitation is important following a joint replacement. After just a few days of immobilization, the muscles of the leg can become weakened and shrink (atrophy).  These exercises are designed to build up the tone and strength of the thigh and leg muscles and to  improve motion. Often times heat used for twenty to thirty minutes before working out will loosen up your tissues and help with improving the range of motion but do not use heat for the first two weeks following surgery (sometimes heat can increase post-operative swelling).   These exercises can be done on a training (exercise) mat, on the floor, on a table or  on a bed. Use whatever works the best and is most comfortable for you.    Use music or television while you are exercising so that the exercises are a pleasant break in your day. This will make your life better with the exercises acting as a break in your routine that you can look forward to.   Perform all exercises about fifteen times, three times per day or as directed.  You should exercise both the operative leg and the other leg as well.  Exercises include:   Quad Sets - Tighten up the muscle on the front of the thigh (Quad) and hold for 5-10 seconds.   Straight Leg Raises - With your knee straight (if you were given a brace, keep it on), lift the leg to 60 degrees, hold for 3 seconds, and slowly lower the leg.  Perform this exercise against resistance later as your leg gets stronger.  Leg Slides: Lying on your back, slowly slide your foot toward your buttocks, bending your knee up off the floor (only go as far as is comfortable). Then slowly slide your foot back down until your leg is flat on the floor again.  Angel Wings: Lying on your back spread your legs to the side as far apart as you can without causing discomfort.  Hamstring Strength:  Lying on your back, push your heel against the floor with your leg straight by tightening up the muscles of your buttocks.  Repeat, but this time bend your knee to a comfortable angle, and push your heel against the floor.  You may put a pillow under the heel to make it more comfortable if necessary.   A rehabilitation program following joint replacement surgery can speed recovery and prevent re-injury in  the future due to weakened muscles. Contact your doctor or a physical therapist for more information on knee rehabilitation.    CONSTIPATION  Constipation is defined medically as fewer than three stools per week and severe constipation as less than one stool per week.  Even if you have a regular bowel pattern at home, your normal regimen is likely to be disrupted due to multiple reasons following surgery.  Combination of anesthesia, postoperative narcotics, change in appetite and fluid intake all can affect your bowels.   YOU MUST use at least one of the following options; they are listed in order of increasing strength to get the job done.  They are all available over the counter, and you may need to use some, POSSIBLY even all of these options:    Drink plenty of fluids (prune juice may be helpful) and high fiber foods Colace 100 mg by mouth twice a day  Senokot for constipation as directed and as needed Dulcolax (bisacodyl), take with full glass of water  Miralax (polyethylene glycol) once or twice a day as needed.  If you have tried all these things and are unable to have a bowel movement in the first 3-4 days after surgery call either your surgeon or your primary doctor.    If you experience loose stools or diarrhea, hold the medications until you stool forms back up.  If your symptoms do not get better within 1 week or if they get worse, check with your doctor.  If you experience "the worst abdominal pain ever" or develop nausea or vomiting, please contact the office immediately for further recommendations for treatment.   ITCHING:  If you experience itching with your medications, try taking only a single pain pill, or even  half a pain pill at a time.  You can also use Benadryl over the counter for itching or also to help with sleep.   TED HOSE STOCKINGS:  Use stockings on both legs until for at least 2 weeks or as directed by physician office. They may be removed at night for  sleeping.  MEDICATIONS:  See your medication summary on the "After Visit Summary" that nursing will review with you.  You may have some home medications which will be placed on hold until you complete the course of blood thinner medication.  It is important for you to complete the blood thinner medication as prescribed.  PRECAUTIONS:  If you experience chest pain or shortness of breath - call 911 immediately for transfer to the hospital emergency department.   If you develop a fever greater that 101 F, purulent drainage from wound, increased redness or drainage from wound, foul odor from the wound/dressing, or calf pain - CONTACT YOUR SURGEON.                                                   FOLLOW-UP APPOINTMENTS:  If you do not already have a post-op appointment, please call the office for an appointment to be seen by your surgeon.  Guidelines for how soon to be seen are listed in your "After Visit Summary", but are typically between 1-4 weeks after surgery.  OTHER INSTRUCTIONS:   Knee Replacement:  Do not place pillow under knee, focus on keeping the knee straight while resting. CPM instructions: 0-90 degrees, 2 hours in the morning, 2 hours in the afternoon, and 2 hours in the evening. Place foam block, curve side up under heel at all times except when in CPM or when walking.  DO NOT modify, tear, cut, or change the foam block in any way.   MAKE SURE YOU:  Understand these instructions.  Get help right away if you are not doing well or get worse.    Thank you for letting us be a part of your medical care team.  It is a privilege we respect greatly.  We hope these instructions will help you stay on track for a fast and full recovery!   Diagnostic Studies: DG Knee Left Port  Result Date: 04/02/2022 CLINICAL DATA:  Status post left knee replacement. EXAM: PORTABLE LEFT KNEE - 1-2 VIEW COMPARISON:  None Available. FINDINGS: The left femoral and tibial components are well situated. Expected  postoperative changes are noted in the soft tissues anteriorly. IMPRESSION: Status post left total knee arthroplasty. Electronically Signed   By: Marijo Conception M.D.   On: 04/02/2022 11:18    Disposition: Discharge disposition: 01-Home or Self Care       Discharge Instructions     Call MD / Call 911   Complete by: As directed    If you experience chest pain or shortness of breath, CALL 911 and be transported to the hospital emergency room.  If you develope a fever above 101 F, pus (white drainage) or increased drainage or redness at the wound, or calf pain, call your surgeon's office.   Change dressing   Complete by: As directed    Do not remove your dressing.   Constipation Prevention   Complete by: As directed    Drink plenty of fluids.  Prune juice may be helpful.  You may use a stool softener, such as Colace (over the counter) 100 mg twice a day.  Use MiraLax (over the counter) for constipation as needed.   Diet - low sodium heart healthy   Complete by: As directed    Discharge instructions   Complete by: As directed    Elevate toes above nose. Use cryotherapy as needed for pain and swelling.   Do not put a pillow under the knee. Place it under the heel.   Complete by: As directed    Driving restrictions   Complete by: As directed    No driving for 6 weeks   Increase activity slowly as tolerated   Complete by: As directed    Lifting restrictions   Complete by: As directed    No lifting for 6 weeks   Post-operative opioid taper instructions:   Complete by: As directed    POST-OPERATIVE OPIOID TAPER INSTRUCTIONS: It is important to wean off of your opioid medication as soon as possible. If you do not need pain medication after your surgery it is ok to stop day one. Opioids include: Codeine, Hydrocodone(Norco, Vicodin), Oxycodone(Percocet, oxycontin) and hydromorphone amongst others.  Long term and even short term use of opiods can cause: Increased pain  response Dependence Constipation Depression Respiratory depression And more.  Withdrawal symptoms can include Flu like symptoms Nausea, vomiting And more Techniques to manage these symptoms Hydrate well Eat regular healthy meals Stay active Use relaxation techniques(deep breathing, meditating, yoga) Do Not substitute Alcohol to help with tapering If you have been on opioids for less than two weeks and do not have pain than it is ok to stop all together.  Plan to wean off of opioids This plan should start within one week post op of your joint replacement. Maintain the same interval or time between taking each dose and first decrease the dose.  Cut the total daily intake of opioids by one tablet each day Next start to increase the time between doses. The last dose that should be eliminated is the evening dose.      TED hose   Complete by: As directed    Use stockings (TED hose) for 2 weeks on both leg(s).  You may remove them at night for sleeping.   Weight bearing as tolerated   Complete by: As directed         Follow-up Information     Swinteck, Aaron Edelman, MD Follow up in 2 week(s).   Specialty: Orthopedic Surgery Why: For wound re-check Contact information: [address] [place] [id number] [phone]                  Signed: Charlott Rakes, PA-C 04/07/2022, 12:40 PM

## 2022-04-07 NOTE — Therapy (Signed)
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   Arthritis    COPD (chronic obstructive pulmonary disease) ([place])    GERD (gastroesophageal reflux disease)    Hyperlipidemia    Hypertension    Sleep apnea    Past Surgical History:  Procedure Laterality Date   CARDIAC CATHETERIZATION N/A 09/28/2015   Procedure: Left Heart Cath and Coronary Angiography;  Surgeon: Jettie Booze, MD;  Location: [facility];  Service: Cardiovascular;  Laterality: N/A;   COLONOSCOPY     KNEE ARTHROPLASTY Right 11/17/2016   Procedure: COMPUTER ASSISTED TOTAL KNEE ARTHROPLASTY;  Surgeon: Rod Can, MD;  Location: [facility];  Service: Orthopedics;  Laterality: Right;   KNEE ARTHROPLASTY Right 2010   KNEE ARTHROPLASTY Left 04/02/2022   Procedure: COMPUTER ASSISTED TOTAL KNEE ARTHROPLASTY;  Surgeon: Rod Can, MD;  Location: [facility];  Service: Orthopedics;  Laterality: Left;  150   MULTIPLE TOOTH EXTRACTIONS  2000   Patient Active Problem List   Diagnosis Date Noted   S/P total knee arthroplasty, left 04/02/2022   Degenerative arthritis of right knee 11/17/2016   Degenerative joint disease of right knee 11/17/2016   Precordial pain    Chest pain 08/29/2015   HTN (hypertension) 08/29/2015   COPD GOLD 0  07/26/2015   Exertional dyspnea 07/26/2015   OSA (obstructive sleep apnea) 07/26/2015    PCP: Lennie Odor  REFERRING PROVIDER: Dot Lanes  REFERRING DIAG: L TKR  THERAPY  DIAG:  Abnormal posture  Difficulty in walking, not elsewhere classified  Localized edema  Muscle weakness (generalized)  Other lack of coordination  Unsteadiness on feet  History of total left knee replacement (TKR)  Rationale for Evaluation and Treatment: Rehabilitation  ONSET DATE: 04/02/22  SUBJECTIVE:   SUBJECTIVE STATEMENT: Patient reports severe pain in his L knee since he left the hospital. Pain meds allow him to sleep for about 10 minutes. He has been calling the Dr to report his severe pain. They recommended icing and using Tylenol. His back is now also sore, he thinks probably due to his wriggling with pain. He reported chills at one point.  PERTINENT HISTORY:    Charles Leblanc is a 61 y.o. male POD 0 s/p L-TKA. Patient reports modified independence using SPC with mobility at baseline. Patient is now limited by functional impairments (see PT problem list below) and requires min guard for transfers and gait with RW. Patient was able to ambulate 60 feet with RW and min guard and cues for safe walker management. Patient educated on safe sequencing for stair mobility and verbalized safe guarding position for people assisting with mobility. Patient instructed in exercises to facilitate ROM and circulation. Patient will benefit from continued skilled PT interventions to address impairments and progress towards PLOF. Patient has met mobility goals at adequate level for discharge home; will continue to follow if pt  continues acute stay to progress towards Mod I goals.    PAIN:  Are you having pain? Yes: NPRS scale: 10/10 Pain location: L knee on medial joint line and posterior. Entire area is warm to the touch Pain description: cramping, severe Aggravating factors: constant pain Relieving factors: 10 minutes of relief with pain meds  PRECAUTIONS: None  WEIGHT BEARING RESTRICTIONS: No  FALLS:  Has patient fallen in last 6 months? No  LIVING ENVIRONMENT: Lives with: lives  with their family and lives with their spouse Lives in: House/apartment Stairs:  Outside steps, but he has a ramp. Has following equipment at home: Walker - 2 wheeled  OCCUPATION: Deferred  PLOF: Independent  PATIENT GOALS: Pain relief  NEXT MD VISIT:   OBJECTIVE:   DIAGNOSTIC FINDINGS: N/A   COGNITION: Overall cognitive status: Within functional limits for tasks assessed     SENSATION: Not tested  EDEMA:  L knee with mod swelling. Patient reports it is improved from yesterday  MUSCLE LENGTH: Hamstrings: N/T due to severe pain limited movement. Thomas test:   POSTURE: weight shift right  PALPATION: Knee is swollen and TTP on medial joint line and posterior with tightness noted in prox calf, softened somewhat after active movement. Warm to touch. Patient reports chills. His wife has been monitoring his temperature and it has been normal.  LOWER EXTREMITY ROM:  Active ROM Right eval Left eval  Hip flexion    Hip extension    Hip abduction    Hip adduction    Hip internal rotation    Hip external rotation    Knee flexion  65  Knee extension  14  Ankle dorsiflexion    Ankle plantarflexion    Ankle inversion    Ankle eversion     (Blank rows = not tested)  LOWER EXTREMITY MMT:  MMT Right eval Left eval  Hip flexion    Hip extension    Hip abduction    Hip adduction    Hip internal rotation    Hip external rotation    Knee flexion  2+  Knee extension  2+  Ankle dorsiflexion  3  Ankle plantarflexion    Ankle inversion    Ankle eversion     (Blank rows = not tested)   FUNCTIONAL TESTS:  deferred  GAIT: Distance walked: 35' Assistive device utilized: Environmental consultant - 2 wheeled Level of assistance: Modified independence Comments: Severely antalgic on L, with step to gait pattern, minimal to no WB through LLE. Encouraged to try to put some weight on the leg, but he felt the knee would buckle when he tried.   TODAY'S TREATMENT:                                                                                                                               DATE: 04/07/22  Assessment and education   PATIENT EDUCATION:  Education details: POC, concerns regarding posterior and medial L knee pain, tightness  in prox calf. Person educated: Patient and Spouse Education method: Explanation Education comprehension: verbalized understanding  HOME EXERCISE PROGRAM: Patient reports he did not receive HEP. Provided with verbal instruction to perform supine knee press, heel slides, ankle pumps in supine and heel raise/toe raise in sitting. Encouraged to increase ambulation.  ASSESSMENT:  CLINICAL IMPRESSION: Patient is a 61 y.o. who was seen today for physical therapy evaluation and treatment for S/P L TKR. Examination was limited due to severe pain. He is able to move the joint somewhat through flex and ext. Limited ROM and unable to actively complete short kick. His gait is severely abnormal with minimal to no WB through LLE due to pain and fear of his knee buckling, still very painful and heavily reliant on UE support. He reports chills, wife has been monitoring his temp and it has been normal. He has normal swelling, minimal bruising, but does have a tight spot in prox calf, which is very painful. It appeared to soften slightly with active seated heel raises. He acknowledges that he has been mostly lying down since returning from surgery. His pain is currently severely debilitating. Provided VASO to L knee and encouraged encouraged him to contact his surgeon with his concerns.   OBJECTIVE IMPAIRMENTS: Abnormal gait, decreased activity tolerance, decreased balance, decreased coordination, decreased mobility, difficulty walking, decreased ROM, decreased strength, increased muscle spasms, impaired flexibility, improper body mechanics, postural dysfunction, and pain.   ACTIVITY LIMITATIONS: carrying, lifting, bending, sitting, standing, squatting, sleeping,  stairs, transfers, bed mobility, continence, bathing, toileting, dressing, self feeding, and reach over head  PARTICIPATION LIMITATIONS: meal prep, cleaning, laundry, medication management, interpersonal relationship, driving, shopping, community activity, occupation, and yard work  PERSONAL FACTORS: Past/current experiences are also affecting patient's functional outcome.   REHAB POTENTIAL: Good  CLINICAL DECISION MAKING: Evolving/moderate complexity  EVALUATION COMPLEXITY: Moderate   GOALS: Goals reviewed with patient? Yes  SHORT TERM GOALS: Target date: 05/05/2022  I with basic HEP Baseline: Goal status: INITIAL   LONG TERM GOALS: Target date: 06/30/2022   I with final HEP Baseline:  Goal status: INITIAL  2.  Increase L knee strength to at least 4/5 throughout Baseline: 2+/3- Goal status: INITIAL  3.  Increase R knee ROM to at least 5-120, with pain < 3/10 Baseline: 14-65 AROM Goal status: INITIAL  4.  Patient will walk at least 400' with LRAD, MI. Baseline:  Goal status: INITIAL  5.  Patient will report to his normal daily routine with L knee pain of < 3/10 Baseline:  Goal status: INITIAL  PLAN:  PT FREQUENCY: 2x/week  PT DURATION: 12 weeks  PLANNED INTERVENTIONS: Therapeutic exercises, Therapeutic activity, Neuromuscular re-education, Balance training, Gait training, Patient/Family education, Self Care, Joint mobilization, Stair training, DME instructions, Dry Needling, Electrical stimulation, Cryotherapy, Moist heat, Vasopneumatic device, Ultrasound, Ionotophoresis 29m/ml Dexamethasone, and Manual therapy  PLAN FOR NEXT SESSION: Assess status, initiate more formal HEP   SMarcelina Morel DPT 04/07/2022, 12:56 PM

## 2022-04-09 ENCOUNTER — Ambulatory Visit: Payer: BC Managed Care – PPO | Admitting: Physical Therapy

## 2022-04-10 ENCOUNTER — Ambulatory Visit: Payer: BC Managed Care – PPO | Admitting: Physical Therapy

## 2022-04-10 DIAGNOSIS — R262 Difficulty in walking, not elsewhere classified: Secondary | ICD-10-CM | POA: Diagnosis not present

## 2022-04-10 DIAGNOSIS — R600 Localized edema: Secondary | ICD-10-CM

## 2022-04-10 DIAGNOSIS — M6281 Muscle weakness (generalized): Secondary | ICD-10-CM

## 2022-04-10 DIAGNOSIS — R2681 Unsteadiness on feet: Secondary | ICD-10-CM | POA: Diagnosis not present

## 2022-04-10 DIAGNOSIS — R278 Other lack of coordination: Secondary | ICD-10-CM | POA: Diagnosis not present

## 2022-04-10 DIAGNOSIS — R293 Abnormal posture: Secondary | ICD-10-CM | POA: Diagnosis not present

## 2022-04-10 DIAGNOSIS — Z96652 Presence of left artificial knee joint: Secondary | ICD-10-CM | POA: Diagnosis not present

## 2022-04-10 NOTE — Therapy (Deleted)
[facility] [address] [place], [place], [address] Phone: [phone]   Fax:  [phone]  Patient Details  Name: Charles Leblanc MRN: [id number] Date of Birth: Oct 22, 1960 Referring Provider:  Milus Height, PA  Encounter Date: 04/10/2022   Suanne Marker, PTA 04/10/2022, 4:17 PM  [facility] [address] [place], [place], [address] Phone: [phone]   Fax:  [phone]

## 2022-04-10 NOTE — Therapy (Signed)
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   Arthritis    COPD (chronic obstructive pulmonary disease) ([place])    GERD (gastroesophageal reflux disease)    Hyperlipidemia    Hypertension    Sleep apnea    Past Surgical History:  Procedure Laterality Date   CARDIAC CATHETERIZATION N/A 09/28/2015   Procedure: Left Heart Cath and Coronary Angiography;  Surgeon: Charles Booze, MD;  Location: [place] [facility];  Service: Cardiovascular;  Laterality: N/A;   COLONOSCOPY     KNEE ARTHROPLASTY Right 11/17/2016   Procedure: COMPUTER ASSISTED TOTAL KNEE ARTHROPLASTY;  Surgeon: Charles Can, MD;  Location: [place];  Service: Orthopedics;  Laterality: Right;   KNEE ARTHROPLASTY Right 2010   KNEE ARTHROPLASTY Left 04/02/2022   Procedure: COMPUTER ASSISTED TOTAL KNEE ARTHROPLASTY;  Surgeon: Charles Can, MD;  Location: [facility];  Service: Orthopedics;  Laterality: Left;  150   MULTIPLE TOOTH EXTRACTIONS  2000   Patient Active Problem List   Diagnosis Date Noted   S/P total knee arthroplasty, left 04/02/2022   Degenerative arthritis of right knee 11/17/2016   Degenerative joint disease of right knee 11/17/2016   Precordial pain    Chest pain 08/29/2015   HTN (hypertension) 08/29/2015   COPD GOLD 0  07/26/2015   Exertional dyspnea 07/26/2015   OSA (obstructive sleep apnea) 07/26/2015    PCP: Charles Leblanc  REFERRING PROVIDER: Dot Leblanc  REFERRING DIAG: L TKR  THERAPY DIAG:  Difficulty in walking, not elsewhere classified  Localized edema  Muscle weakness  (generalized)  Rationale for Evaluation and Treatment: Rehabilitation  ONSET DATE: 04/02/22  SUBJECTIVE:   SUBJECTIVE STATEMENT: pt arrives stating feeling some better. Working knee ROM. Chief complaint is back pain from " epidural", heavy UE use on walker with fwd flexion  PERTINENT HISTORY:    Charles Leblanc is a 61 y.o. male POD 0 s/p L-TKA. Patient reports modified independence using SPC with mobility at baseline. Patient is now limited by functional impairments (see PT problem list below) and requires min guard for transfers and gait with RW. Patient was able to ambulate 60 feet with RW and min guard and cues for safe walker management. Patient educated on safe sequencing for stair mobility and verbalized safe guarding position for people assisting with mobility. Patient instructed in exercises to facilitate ROM and circulation. Patient will benefit from continued skilled PT interventions to address impairments and progress towards PLOF. Patient has met mobility goals at adequate level for discharge home; will continue to follow if pt continues acute stay to progress towards Mod I goals.    PAIN:  Are you having pain? Yes: NPRS scale: 10/10 Pain location: Back, knee 5/10 Pain description: cramping, severe Aggravating factors: constant pain Relieving factors: 10 minutes of relief with pain meds  PRECAUTIONS: None  WEIGHT BEARING RESTRICTIONS: No  FALLS:  Has patient fallen in last 6 months? No  LIVING ENVIRONMENT: Lives with: lives with their family and lives with  their spouse Lives in: House/apartment Stairs:  Outside steps, but he has a ramp. Has following equipment at home: Walker - 2 wheeled  OCCUPATION: Deferred  PLOF: Independent  PATIENT GOALS: Pain relief  NEXT MD VISIT:   OBJECTIVE:   DIAGNOSTIC FINDINGS: N/A   COGNITION: Overall cognitive status: Within functional limits for tasks assessed     SENSATION: Not tested  EDEMA:  L knee with mod swelling.  Patient reports it is improved from yesterday  MUSCLE LENGTH: Hamstrings: N/T due to severe pain limited movement. Thomas test:   POSTURE: weight shift right  PALPATION: Knee is swollen and TTP on medial joint line and posterior with tightness noted in prox calf, softened somewhat after active movement. Warm to touch. Patient reports chills. His wife has been monitoring his temperature and it has been normal.  LOWER EXTREMITY ROM:  Active ROM Right eval Left eval Left sitting 04/10/22  Hip flexion     Hip extension     Hip abduction     Hip adduction     Hip internal rotation     Hip external rotation     Knee flexion  65 90  Knee extension  14 3  Ankle dorsiflexion     Ankle plantarflexion     Ankle inversion     Ankle eversion      (Blank rows = not tested)  LOWER EXTREMITY MMT:  MMT Right eval Left eval  Hip flexion    Hip extension    Hip abduction    Hip adduction    Hip internal rotation    Hip external rotation    Knee flexion  2+  Knee extension  2+  Ankle dorsiflexion  3  Ankle plantarflexion    Ankle inversion    Ankle eversion     (Blank rows = not tested)   FUNCTIONAL TESTS:  deferred  GAIT: Distance walked: 29' Assistive device utilized: Environmental consultant - 2 wheeled Level of assistance: Modified independence Comments: Severely antalgic on L, with step to gait pattern, minimal to no WB through LLE. Encouraged to try to put some weight on the leg, but he felt the knee would buckle when he tried.   TODAY'S TREATMENT:                                                                                                                              DATE:  04/10/22 Nustep L 4 5 min PROM/stretch flexion and ext Quad set 10 x hold 3 sec SAQ 10 x hold 3 sec SAQ into SLR 10 x Supine mod leg press with PTA resistance 10 x LAQ 10x IFC to left LB in supine with Vaso to Left knee      04/07/22  Assessment and education   PATIENT EDUCATION:  Education  details- rec calling MD regarding severe LBP Person educated: Patient and Spouse Education method: Explanation Education comprehension: verbalized understanding  HOME EXERCISE PROGRAM:  Issued HEP quad  set, SAQ,SLR,heel slide, LAQ and knee flexion seated- demo and H.O ASSESSMENT:  CLINICAL IMPRESSION: pt arrives with less knee pain and excellent increase in ROM and tolerance to ex but severe LBP limiting session tolerance. Pt ambulates in trunk flexion with heavy UE support on walker d/t back pain. Upon exam tenderness in left LB so tried IFC to see if it would help with pain control. Rec pt contact MD as this is not normal Pt was in tears most of session but did verb " that helped alt" speaking of estim   OBJECTIVE IMPAIRMENTS: Abnormal gait, decreased activity tolerance, decreased balance, decreased coordination, decreased mobility, difficulty walking, decreased ROM, decreased strength, increased muscle spasms, impaired flexibility, improper body mechanics, postural dysfunction, and pain.   ACTIVITY LIMITATIONS: carrying, lifting, bending, sitting, standing, squatting, sleeping, stairs, transfers, bed mobility, continence, bathing, toileting, dressing, self feeding, and reach over head  PARTICIPATION LIMITATIONS: meal prep, cleaning, laundry, medication management, interpersonal relationship, driving, shopping, community activity, occupation, and yard work  PERSONAL FACTORS: Past/current experiences are also affecting patient's functional outcome.   REHAB POTENTIAL: Good  CLINICAL DECISION MAKING: Evolving/moderate complexity  EVALUATION COMPLEXITY: Moderate   GOALS: Goals reviewed with patient? Yes  SHORT TERM GOALS: Target date: 05/05/2022  I with basic HEP Baseline: Goal status: INITIAL   LONG TERM GOALS: Target date: 06/30/2022   I with final HEP Baseline:  Goal status: INITIAL  2.  Increase L knee strength to at least 4/5 throughout Baseline: 2+/3- Goal status:  INITIAL  3.  Increase R knee ROM to at least 5-120, with pain < 3/10 Baseline: 14-65 AROM Goal status: INITIAL  4.  Patient will walk at least 400' with LRAD, MI. Baseline:  Goal status: INITIAL  5.  Patient will report to his normal daily routine with L knee pain of < 3/10 Baseline:  Goal status: INITIAL  PLAN:  PT FREQUENCY: 2x/week  PT DURATION: 12 weeks  PLANNED INTERVENTIONS: Therapeutic exercises, Therapeutic activity, Neuromuscular re-education, Balance training, Gait training, Patient/Family education, Self Care, Joint mobilization, Stair training, DME instructions, Dry Needling, Electrical stimulation, Cryotherapy, Moist heat, Vasopneumatic device, Ultrasound, Ionotophoresis 14m/ml Dexamethasone, and Manual therapy  PLAN FOR NEXT SESSION: Assess and progress   Charles MontePTA 04/10/2022, 4:17 PM [place] [address]Phone: [phone]  Fax:  [phone] Patient Details  Name: Charles CAMPLINMRN: [id number] of Birth: 5November 05, 1962Referring Provider:  RLennie Odor PA  Encounter Date: 04/10/2022   PLaqueta Carina PTA 04/10/2022, 4:17 PM  [place] [address]Phone: [phone]  Fax:  [phone]

## 2022-04-14 ENCOUNTER — Ambulatory Visit: Payer: BC Managed Care – PPO | Admitting: Physical Therapy

## 2022-04-14 ENCOUNTER — Encounter: Payer: Self-pay | Admitting: Physical Therapy

## 2022-04-14 DIAGNOSIS — R2681 Unsteadiness on feet: Secondary | ICD-10-CM | POA: Diagnosis not present

## 2022-04-14 DIAGNOSIS — R278 Other lack of coordination: Secondary | ICD-10-CM | POA: Diagnosis not present

## 2022-04-14 DIAGNOSIS — R293 Abnormal posture: Secondary | ICD-10-CM | POA: Diagnosis not present

## 2022-04-14 DIAGNOSIS — R262 Difficulty in walking, not elsewhere classified: Secondary | ICD-10-CM

## 2022-04-14 DIAGNOSIS — Z96652 Presence of left artificial knee joint: Secondary | ICD-10-CM | POA: Diagnosis not present

## 2022-04-14 DIAGNOSIS — R600 Localized edema: Secondary | ICD-10-CM | POA: Diagnosis not present

## 2022-04-14 DIAGNOSIS — M6281 Muscle weakness (generalized): Secondary | ICD-10-CM

## 2022-04-14 NOTE — Therapy (Signed)
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   Arthritis    COPD (chronic obstructive pulmonary disease) ([place])    GERD (gastroesophageal reflux disease)    Hyperlipidemia    Hypertension    Sleep apnea    Past Surgical History:  Procedure Laterality Date   CARDIAC CATHETERIZATION N/A 09/28/2015   Procedure: Left Heart Cath and Coronary Angiography;  Surgeon: Jettie Booze, MD;  Location: [place] [facility];  Service: Cardiovascular;  Laterality: N/A;   COLONOSCOPY     KNEE ARTHROPLASTY Right 11/17/2016   Procedure: COMPUTER ASSISTED TOTAL KNEE ARTHROPLASTY;  Surgeon: Rod Can, MD;  Location: [place];  Service: Orthopedics;  Laterality: Right;   KNEE ARTHROPLASTY Right 2010   KNEE ARTHROPLASTY Left 04/02/2022   Procedure: COMPUTER ASSISTED TOTAL KNEE ARTHROPLASTY;  Surgeon: Rod Can, MD;  [facility];  Service: Orthopedics;  Laterality: Left;  150   MULTIPLE TOOTH EXTRACTIONS  2000   Patient Active Problem List   Diagnosis Date Noted   S/P total knee arthroplasty, left 04/02/2022   Degenerative arthritis of right knee 11/17/2016   Degenerative joint disease of right knee 11/17/2016   Precordial pain    Chest pain 08/29/2015   HTN (hypertension) 08/29/2015   COPD GOLD 0  07/26/2015   Exertional dyspnea 07/26/2015   OSA (obstructive sleep apnea) 07/26/2015    PCP: Lennie Odor  REFERRING PROVIDER: Dot Lanes  REFERRING DIAG: L TKR  THERAPY DIAG:   Difficulty in walking, not elsewhere classified  Localized edema  Muscle weakness (generalized)  Unsteadiness on feet  Rationale for Evaluation and Treatment: Rehabilitation  ONSET DATE: 04/02/22  SUBJECTIVE:   SUBJECTIVE STATEMENT: "Alright" Still having trouble sleeping  PERTINENT HISTORY:    Charles Leblanc is a 61 y.o. male POD 0 s/p L-TKA. Patient reports modified independence using SPC with mobility at baseline. Patient is now limited by functional impairments (see PT problem list below) and requires min guard for transfers and gait with RW. Patient was able to ambulate 60 feet with RW and min guard and cues for safe walker management. Patient educated on safe sequencing for stair mobility and verbalized safe guarding position for people assisting with mobility. Patient instructed in exercises to facilitate ROM and circulation. Patient will benefit from continued skilled PT interventions to address impairments and progress towards PLOF. Patient has met mobility goals at adequate level for discharge home; will continue to follow if pt continues acute stay to progress towards Mod I goals.    PAIN:  Are you having pain? Yes: NPRS scale: 4/10 Pain location: Back, knee  Pain description: cramping, severe Aggravating factors: constant pain Relieving factors: 10 minutes of relief with pain meds  PRECAUTIONS: None  WEIGHT BEARING RESTRICTIONS: No  FALLS:  Has patient fallen in last 6 months? No  LIVING ENVIRONMENT: Lives with: lives with their family  and lives with their spouse Lives in: House/apartment Stairs:  Outside steps, but he has a ramp. Has following equipment at home: Walker - 2 wheeled  OCCUPATION: Deferred  PLOF: Independent  PATIENT GOALS: Pain relief  NEXT MD VISIT:   OBJECTIVE:   DIAGNOSTIC FINDINGS: N/A   COGNITION: Overall cognitive status: Within functional limits for tasks assessed     SENSATION: Not tested  EDEMA:  L knee with mod  swelling. Patient reports it is improved from yesterday  MUSCLE LENGTH: Hamstrings: N/T due to severe pain limited movement. Thomas test:   POSTURE: weight shift right  PALPATION: Knee is swollen and TTP on medial joint line and posterior with tightness noted in prox calf, softened somewhat after active movement. Warm to touch. Patient reports chills. His wife has been monitoring his temperature and it has been normal.  LOWER EXTREMITY ROM:  Active ROM Right eval Left eval Left sitting 04/10/22  Hip flexion     Hip extension     Hip abduction     Hip adduction     Hip internal rotation     Hip external rotation     Knee flexion  65 90  Knee extension  14 3  Ankle dorsiflexion     Ankle plantarflexion     Ankle inversion     Ankle eversion      (Blank rows = not tested)  LOWER EXTREMITY MMT:  MMT Right eval Left eval  Hip flexion    Hip extension    Hip abduction    Hip adduction    Hip internal rotation    Hip external rotation    Knee flexion  2+  Knee extension  2+  Ankle dorsiflexion  3  Ankle plantarflexion    Ankle inversion    Ankle eversion     (Blank rows = not tested)   FUNCTIONAL TESTS:  deferred  GAIT: Distance walked: 60' Assistive device utilized: Environmental consultant - 2 wheeled Level of assistance: Modified independence Comments: Severely antalgic on L, with step to gait pattern, minimal to no WB through LLE. Encouraged to try to put some weight on the leg, but he felt the knee would buckle when he tried.   TODAY'S TREATMENT:                                                                                                                              DATE: 04/14/22 NuStep L3 x6 min L knee PROM with end range holds LLE LAQ 1.5lb 2x10 Hamstring curls red 2x10 S2S elevated mat 2x5 Standing march 2x5  Gait SPC 153f  Leg press 20lb x12   04/10/22 Nustep L 4 5 min PROM/stretch flexion and ext Quad set 10 x hold 3 sec SAQ 10 x hold 3 sec SAQ into  SLR 10 x Supine mod leg press with PTA resistance 10 x LAQ 10x IFC to left LB in supine with Vaso to Left knee  04/07/22  Assessment and education   PATIENT EDUCATION:  Education details- rec calling MD regarding severe LBP Person educated: Patient and Spouse Education method: Explanation Education comprehension: verbalized understanding  HOME EXERCISE PROGRAM:  Issued HEP quad set, SAQ,SLR,heel slide, LAQ and knee flexion seated- demo and H.O ASSESSMENT:  CLINICAL IMPRESSION: Pt enters with less pain compared to last session. He has good R knee PROM despite some swelling. Pt able to progress tolerating some weight bearing in LLE.Cues for full ROM needed with LAQ and HS curls. Pt did state ambulating with SPC at home, despite using RW with heavy UE use entering clinic. Denied modality post session.   OBJECTIVE IMPAIRMENTS: Abnormal gait, decreased activity tolerance, decreased balance, decreased coordination, decreased mobility, difficulty walking, decreased ROM, decreased strength, increased muscle spasms, impaired flexibility, improper body mechanics, postural dysfunction, and pain.   ACTIVITY LIMITATIONS: carrying, lifting, bending, sitting, standing, squatting, sleeping, stairs, transfers, bed mobility, continence, bathing, toileting, dressing, self feeding, and reach over head  PARTICIPATION LIMITATIONS: meal prep, cleaning, laundry, medication management, interpersonal relationship, driving, shopping, community activity, occupation, and yard work  PERSONAL FACTORS: Past/current experiences are also affecting patient's functional outcome.   REHAB POTENTIAL: Good  CLINICAL DECISION MAKING: Evolving/moderate complexity  EVALUATION COMPLEXITY: Moderate   GOALS: Goals reviewed with patient? Yes  SHORT TERM GOALS: Target date: 05/05/2022  I with basic HEP Baseline: Goal status: INITIAL   LONG TERM GOALS: Target date: 06/30/2022   I with final HEP Baseline:   Goal status: INITIAL  2.  Increase L knee strength to at least 4/5 throughout Baseline: 2+/3- Goal status: INITIAL  3.  Increase R knee ROM to at least 5-120, with pain < 3/10 Baseline: 14-65 AROM Goal status: INITIAL  4.  Patient will walk at least 400' with LRAD, MI. Baseline:  Goal status: INITIAL  5.  Patient will report to his normal daily routine with L knee pain of < 3/10 Baseline:  Goal status: INITIAL  PLAN:  PT FREQUENCY: 2x/week  PT DURATION: 12 weeks  PLANNED INTERVENTIONS: Therapeutic exercises, Therapeutic activity, Neuromuscular re-education, Balance training, Gait training, Patient/Family education, Self Care, Joint mobilization, Stair training, DME instructions, Dry Needling, Electrical stimulation, Cryotherapy, Moist heat, Vasopneumatic device, Ultrasound, Ionotophoresis 11m/ml Dexamethasone, and Manual therapy  PLAN FOR NEXT SESSION: Assess and progress   AFranki MontePTA 04/14/2022, 8:49 AM [place] [address]Phone: [phone]  Fax:  [phone] Patient Details  Name: JCARSEN LEAFMRN: [id number] of Birth: 508-02-1962Referring Provider:  RLennie Odor PA  Encounter Date: 04/14/2022   RScot Jun PTA 04/14/2022, 8:49 AM  [place] [address]Phone: [phone]  Fax:  [phone]

## 2022-04-15 DIAGNOSIS — Z471 Aftercare following joint replacement surgery: Secondary | ICD-10-CM | POA: Diagnosis not present

## 2022-04-15 DIAGNOSIS — Z96652 Presence of left artificial knee joint: Secondary | ICD-10-CM | POA: Diagnosis not present

## 2022-04-16 ENCOUNTER — Ambulatory Visit: Payer: BC Managed Care – PPO | Admitting: Physical Therapy

## 2022-04-21 ENCOUNTER — Ambulatory Visit: Payer: BC Managed Care – PPO | Admitting: Physical Therapy

## 2022-04-21 ENCOUNTER — Encounter: Payer: Self-pay | Admitting: Physical Therapy

## 2022-04-21 DIAGNOSIS — R278 Other lack of coordination: Secondary | ICD-10-CM | POA: Diagnosis not present

## 2022-04-21 DIAGNOSIS — M6281 Muscle weakness (generalized): Secondary | ICD-10-CM

## 2022-04-21 DIAGNOSIS — R2681 Unsteadiness on feet: Secondary | ICD-10-CM | POA: Diagnosis not present

## 2022-04-21 DIAGNOSIS — Z96652 Presence of left artificial knee joint: Secondary | ICD-10-CM | POA: Diagnosis not present

## 2022-04-21 DIAGNOSIS — R262 Difficulty in walking, not elsewhere classified: Secondary | ICD-10-CM | POA: Diagnosis not present

## 2022-04-21 DIAGNOSIS — R600 Localized edema: Secondary | ICD-10-CM | POA: Diagnosis not present

## 2022-04-21 DIAGNOSIS — R293 Abnormal posture: Secondary | ICD-10-CM | POA: Diagnosis not present

## 2022-04-21 NOTE — Therapy (Signed)
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   Arthritis    COPD (chronic obstructive pulmonary disease) ([place])    GERD (gastroesophageal reflux disease)    Hyperlipidemia    Hypertension    Sleep apnea    Past Surgical History:  Procedure Laterality Date   CARDIAC CATHETERIZATION N/A 09/28/2015   Procedure: Left Heart Cath and Coronary Angiography;  Surgeon: Jettie Booze, MD;  Location: [facility];  Service: Cardiovascular;  Laterality: N/A;   COLONOSCOPY     KNEE ARTHROPLASTY Right 11/17/2016   Procedure: COMPUTER ASSISTED TOTAL KNEE ARTHROPLASTY;  Surgeon: Rod Can, MD;  Location: [place];  Service: Orthopedics;  Laterality: Right;   KNEE ARTHROPLASTY Right 2010   KNEE ARTHROPLASTY Left 04/02/2022   Procedure: COMPUTER ASSISTED TOTAL KNEE ARTHROPLASTY;  Surgeon: Rod Can, MD;  Location: [facility];  Service: Orthopedics;  Laterality: Left;  150   MULTIPLE TOOTH EXTRACTIONS  2000   Patient Active Problem List   Diagnosis Date Noted   S/P total knee arthroplasty, left 04/02/2022   Degenerative arthritis of right knee 11/17/2016   Degenerative joint disease of right knee 11/17/2016   Precordial pain    Chest pain 08/29/2015   HTN (hypertension) 08/29/2015   COPD GOLD 0  07/26/2015   Exertional dyspnea 07/26/2015   OSA (obstructive sleep apnea) 07/26/2015    PCP: Lennie Odor  REFERRING PROVIDER: Dot Lanes  REFERRING DIAG: L  TKR  THERAPY DIAG:  Difficulty in walking, not elsewhere classified  Localized edema  Muscle weakness (generalized)  Unsteadiness on feet  Rationale for Evaluation and Treatment: Rehabilitation  ONSET DATE: 04/02/22  SUBJECTIVE:   SUBJECTIVE STATEMENT: Ok, just sore  PERTINENT HISTORY:    Charles Leblanc is a 61 y.o. male POD 0 s/p L-TKA. Patient reports modified independence using SPC with mobility at baseline. Patient is now limited by functional impairments (see PT problem list below) and requires min guard for transfers and gait with RW. Patient was able to ambulate 60 feet with RW and min guard and cues for safe walker management. Patient educated on safe sequencing for stair mobility and verbalized safe guarding position for people assisting with mobility. Patient instructed in exercises to facilitate ROM and circulation. Patient will benefit from continued skilled PT interventions to address impairments and progress towards PLOF. Patient has met mobility goals at adequate level for discharge home; will continue to follow if pt continues acute stay to progress towards Mod I goals.    PAIN:  Are you having pain? Yes: NPRS scale: 4/10 Pain location: Back, knee  Pain description: cramping, severe Aggravating factors: constant pain Relieving factors: 10 minutes of relief with pain meds  PRECAUTIONS: None  WEIGHT BEARING RESTRICTIONS: No  FALLS:  Has patient fallen in last 6 months? No  LIVING ENVIRONMENT: Lives with: lives with their family and lives  with their spouse Lives in: House/apartment Stairs:  Outside steps, but he has a ramp. Has following equipment at home: Walker - 2 wheeled  OCCUPATION: Deferred  PLOF: Independent  PATIENT GOALS: Pain relief  NEXT MD VISIT:   OBJECTIVE:   DIAGNOSTIC FINDINGS: N/A   COGNITION: Overall cognitive status: Within functional limits for tasks assessed     SENSATION: Not tested  EDEMA:  L knee with mod swelling.  Patient reports it is improved from yesterday  MUSCLE LENGTH: Hamstrings: N/T due to severe pain limited movement. Thomas test:   POSTURE: weight shift right  PALPATION: Knee is swollen and TTP on medial joint line and posterior with tightness noted in prox calf, softened somewhat after active movement. Warm to touch. Patient reports chills. His wife has been monitoring his temperature and it has been normal.  LOWER EXTREMITY ROM:  Active ROM Right eval Left eval Left sitting 04/10/22  Hip flexion     Hip extension     Hip abduction     Hip adduction     Hip internal rotation     Hip external rotation     Knee flexion  65 90  Knee extension  14 3  Ankle dorsiflexion     Ankle plantarflexion     Ankle inversion     Ankle eversion      (Blank rows = not tested)  LOWER EXTREMITY MMT:  MMT Right eval Left eval  Hip flexion    Hip extension    Hip abduction    Hip adduction    Hip internal rotation    Hip external rotation    Knee flexion  2+  Knee extension  2+  Ankle dorsiflexion  3  Ankle plantarflexion    Ankle inversion    Ankle eversion     (Blank rows = not tested)   FUNCTIONAL TESTS:  deferred  GAIT: Distance walked: 84' Assistive device utilized: Environmental consultant - 2 wheeled Level of assistance: Modified independence Comments: Severely antalgic on L, with step to gait pattern, minimal to no WB through LLE. Encouraged to try to put some weight on the leg, but he felt the knee would buckle when he tried.   TODAY'S TREATMENT:                                                                                                                              DATE: 04/21/22 NuStep L3 x 6 min LE only LAQ LLE 3lb 2x10 Hamstring curls green 2x12 S2S LLE red TKE 2x10 Leg press 40lb 2x12 LLE 4 in step up HHA x1 ten reps   04/14/22 NuStep L3 x6 min L knee PROM with end range holds LLE LAQ 1.5lb 2x10 Hamstring curls red 2x10 S2S elevated mat 2x5 Standing march 2x5   Gait SPC 158f  Leg press 20lb x12   04/10/22 Nustep L 4 5 min PROM/stretch flexion and ext Quad set 10 x hold 3 sec SAQ 10  x hold 3 sec SAQ into SLR 10 x Supine mod leg press with PTA resistance 10 x LAQ 10x IFC to left LB in supine with Vaso to Left knee      04/07/22  Assessment and education   PATIENT EDUCATION:  Education details- rec calling MD regarding severe LBP Person educated: Patient and Spouse Education method: Explanation Education comprehension: verbalized understanding  HOME EXERCISE PROGRAM:  Issued HEP quad set, SAQ,SLR,heel slide, LAQ and knee flexion seated- demo and H.O ASSESSMENT:  CLINICAL IMPRESSION: Pt enters ambulating with SPC reporting some L knee soreness.He has full L knee PROM. Session focused on LLE strengthening with some function interventions. No issues completing today's interventions. Cue for L quad contraction with LAQ. Some compensation with sit to stands.     OBJECTIVE IMPAIRMENTS: Abnormal gait, decreased activity tolerance, decreased balance, decreased coordination, decreased mobility, difficulty walking, decreased ROM, decreased strength, increased muscle spasms, impaired flexibility, improper body mechanics, postural dysfunction, and pain.   ACTIVITY LIMITATIONS: carrying, lifting, bending, sitting, standing, squatting, sleeping, stairs, transfers, bed mobility, continence, bathing, toileting, dressing, self feeding, and reach over head  PARTICIPATION LIMITATIONS: meal prep, cleaning, laundry, medication management, interpersonal relationship, driving, shopping, community activity, occupation, and yard work  PERSONAL FACTORS: Past/current experiences are also affecting patient's functional outcome.   REHAB POTENTIAL: Good  CLINICAL DECISION MAKING: Evolving/moderate complexity  EVALUATION COMPLEXITY: Moderate   GOALS: Goals reviewed with patient? Yes  SHORT TERM GOALS: Target date: 05/05/2022  I with basic  HEP Baseline: Goal status: INITIAL   LONG TERM GOALS: Target date: 06/30/2022   I with final HEP Baseline:  Goal status: INITIAL  2.  Increase L knee strength to at least 4/5 throughout Baseline: 2+/3- Goal status: INITIAL  3.  Increase R knee ROM to at least 5-120, with pain < 3/10 Baseline: 14-65 AROM Goal status: INITIAL  4.  Patient will walk at least 400' with LRAD, MI. Baseline:  Goal status: INITIAL  5.  Patient will report to his normal daily routine with L knee pain of < 3/10 Baseline:  Goal status: INITIAL  PLAN:  PT FREQUENCY: 2x/week  PT DURATION: 12 weeks  PLANNED INTERVENTIONS: Therapeutic exercises, Therapeutic activity, Neuromuscular re-education, Balance training, Gait training, Patient/Family education, Self Care, Joint mobilization, Stair training, DME instructions, Dry Needling, Electrical stimulation, Cryotherapy, Moist heat, Vasopneumatic device, Ultrasound, Ionotophoresis 51m/ml Dexamethasone, and Manual therapy  PLAN FOR NEXT SESSION: Assess and progress   AFranki MontePTA 04/21/2022, 8:57 AM [place] [id number]: [phone]  Fax:  [phone] Patient Details  Name: JKAIAN FAHSMRN: [id number] of Birth: 5February 20, 1962Referring Provider:  RLennie Odor PA  Encounter Date: 04/21/2022   RScot Jun PTA 04/21/2022, 8:57 AM  [place] [id number]: [phone]  Fax:  [phone]

## 2022-04-23 ENCOUNTER — Encounter: Payer: Self-pay | Admitting: Physical Therapy

## 2022-04-23 ENCOUNTER — Ambulatory Visit: Payer: BC Managed Care – PPO | Admitting: Physical Therapy

## 2022-04-23 DIAGNOSIS — R2681 Unsteadiness on feet: Secondary | ICD-10-CM | POA: Diagnosis not present

## 2022-04-23 DIAGNOSIS — R262 Difficulty in walking, not elsewhere classified: Secondary | ICD-10-CM | POA: Diagnosis not present

## 2022-04-23 DIAGNOSIS — M6281 Muscle weakness (generalized): Secondary | ICD-10-CM

## 2022-04-23 DIAGNOSIS — R293 Abnormal posture: Secondary | ICD-10-CM | POA: Diagnosis not present

## 2022-04-23 DIAGNOSIS — R278 Other lack of coordination: Secondary | ICD-10-CM | POA: Diagnosis not present

## 2022-04-23 DIAGNOSIS — R600 Localized edema: Secondary | ICD-10-CM | POA: Diagnosis not present

## 2022-04-23 DIAGNOSIS — Z96652 Presence of left artificial knee joint: Secondary | ICD-10-CM | POA: Diagnosis not present

## 2022-04-23 NOTE — Therapy (Signed)
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   Arthritis    COPD (chronic obstructive pulmonary disease) ([place])    GERD (gastroesophageal reflux disease)    Hyperlipidemia    Hypertension    Sleep apnea    Past Surgical History:  Procedure Laterality Date   CARDIAC CATHETERIZATION N/A 09/28/2015   Procedure: Left Heart Cath and Coronary Angiography;  Surgeon: Jettie Booze, MD;  Location: [place] [facility];  Service: Cardiovascular;  Laterality: N/A;   COLONOSCOPY     KNEE ARTHROPLASTY Right 11/17/2016   Procedure: COMPUTER ASSISTED TOTAL KNEE ARTHROPLASTY;  Surgeon: Rod Can, MD;  Location: [place];  Service: Orthopedics;  Laterality: Right;   KNEE ARTHROPLASTY Right 2010   KNEE ARTHROPLASTY Left 04/02/2022   Procedure: COMPUTER ASSISTED TOTAL KNEE ARTHROPLASTY;  Surgeon: Rod Can, MD;  [facility];  Service: Orthopedics;  Laterality: Left;  150   MULTIPLE TOOTH EXTRACTIONS  2000   Patient Active Problem List   Diagnosis Date Noted   S/P total knee arthroplasty, left 04/02/2022   Degenerative arthritis of right knee 11/17/2016   Degenerative joint disease of right knee 11/17/2016   Precordial pain    Chest pain 08/29/2015   HTN (hypertension) 08/29/2015   COPD GOLD 0  07/26/2015   Exertional dyspnea 07/26/2015   OSA (obstructive sleep apnea) 07/26/2015    PCP: Lennie Odor  REFERRING PROVIDER: Dot Lanes  REFERRING DIAG: L  TKR  THERAPY DIAG:  Difficulty in walking, not elsewhere classified  Muscle weakness (generalized)  Localized edema  Rationale for Evaluation and Treatment: Rehabilitation  ONSET DATE: 04/02/22  SUBJECTIVE:   SUBJECTIVE STATEMENT: "Good"  PERTINENT HISTORY:    Charles Leblanc is a 61 y.o. male POD 0 s/p L-TKA. Patient reports modified independence using SPC with mobility at baseline. Patient is now limited by functional impairments (see PT problem list below) and requires min guard for transfers and gait with RW. Patient was able to ambulate 60 feet with RW and min guard and cues for safe walker management. Patient educated on safe sequencing for stair mobility and verbalized safe guarding position for people assisting with mobility. Patient instructed in exercises to facilitate ROM and circulation. Patient will benefit from continued skilled PT interventions to address impairments and progress towards PLOF. Patient has met mobility goals at adequate level for discharge home; will continue to follow if pt continues acute stay to progress towards Mod I goals.    PAIN:  Are you having pain? Yes: NPRS scale: 5/10 Pain location: Back, knee  Pain description: cramping, severe Aggravating factors: constant pain Relieving factors: 10 minutes of relief with pain meds  PRECAUTIONS: None  WEIGHT BEARING RESTRICTIONS: No  FALLS:  Has patient fallen in last 6 months? No  LIVING ENVIRONMENT: Lives with: lives with their family and lives with their spouse Lives in: House/apartment  Stairs:  Outside steps, but he has a ramp. Has following equipment at home: Walker - 2 wheeled  OCCUPATION: Deferred  PLOF: Independent  PATIENT GOALS: Pain relief  NEXT MD VISIT:   OBJECTIVE:   DIAGNOSTIC FINDINGS: N/A   COGNITION: Overall cognitive status: Within functional limits for tasks assessed     SENSATION: Not tested  EDEMA:  L knee with mod swelling. Patient reports it is improved  from yesterday  MUSCLE LENGTH: Hamstrings: N/T due to severe pain limited movement. Thomas test:   POSTURE: weight shift right  PALPATION: Knee is swollen and TTP on medial joint line and posterior with tightness noted in prox calf, softened somewhat after active movement. Warm to touch. Patient reports chills. His wife has been monitoring his temperature and it has been normal.  LOWER EXTREMITY ROM:  Active ROM Right eval Left eval Left sitting 04/10/22  Hip flexion     Hip extension     Hip abduction     Hip adduction     Hip internal rotation     Hip external rotation     Knee flexion  65 90  Knee extension  14 3  Ankle dorsiflexion     Ankle plantarflexion     Ankle inversion     Ankle eversion      (Blank rows = not tested)  LOWER EXTREMITY MMT:  MMT Right eval Left eval  Hip flexion    Hip extension    Hip abduction    Hip adduction    Hip internal rotation    Hip external rotation    Knee flexion  2+  Knee extension  2+  Ankle dorsiflexion  3  Ankle plantarflexion    Ankle inversion    Ankle eversion     (Blank rows = not tested)   FUNCTIONAL TESTS:  deferred  GAIT: Distance walked: 58' Assistive device utilized: Environmental consultant - 2 wheeled Level of assistance: Modified independence Comments: Severely antalgic on L, with step to gait pattern, minimal to no WB through LLE. Encouraged to try to put some weight on the leg, but he felt the knee would buckle when he tried.   TODAY'S TREATMENT:                                                                                                                              DATE: 04/23/22 Bike L1.5 x 6 min Resisted gait 4 way 20lb x3 Step ups 6in x10 each Leg press 20lb 2x10 HS curls 20lb 2x10 Leg Ext 5lb 2x10 LAQ LLE 3lb 2x10  04/21/22 NuStep L3 x 6 min LE only LAQ LLE 3lb 2x10 Hamstring curls green 2x12 S2S LLE red TKE 2x10 Leg press 40lb 2x12 LLE 4 in step up HHA x1 ten reps   04/14/22 NuStep L3 x6  min L knee PROM with end range holds LLE LAQ 1.5lb 2x10 Hamstring curls red 2x10 S2S elevated mat 2x5 Standing march 2x5  Gait  SPC 146f  Leg press 20lb x12   04/10/22 Nustep L 4 5 min PROM/stretch flexion and ext Quad set 10 x hold 3 sec SAQ 10 x hold 3 sec SAQ into SLR 10 x Supine mod leg press with PTA resistance 10 x LAQ 10x IFC to left LB in supine with Vaso to Left knee      04/07/22  Assessment and education   PATIENT EDUCATION:  Education details- rec calling MD regarding severe LBP Person educated: Patient and Spouse Education method: Explanation Education comprehension: verbalized understanding  HOME EXERCISE PROGRAM:  Issued HEP quad set, SAQ,SLR,heel slide, LAQ and knee flexion seated- demo and H.O ASSESSMENT:  CLINICAL IMPRESSION: Pt enters ambulating with SPC reporting some L knee soreness. He has full L knee PROM. Session focused on LLE strengthening with some function interventions. Added step ups with some compensation. Cue to pick up LLE with resisted side steps.No issues completing today's interventions. Cue for L quad contraction with LAQ.   OBJECTIVE IMPAIRMENTS: Abnormal gait, decreased activity tolerance, decreased balance, decreased coordination, decreased mobility, difficulty walking, decreased ROM, decreased strength, increased muscle spasms, impaired flexibility, improper body mechanics, postural dysfunction, and pain.   ACTIVITY LIMITATIONS: carrying, lifting, bending, sitting, standing, squatting, sleeping, stairs, transfers, bed mobility, continence, bathing, toileting, dressing, self feeding, and reach over head  PARTICIPATION LIMITATIONS: meal prep, cleaning, laundry, medication management, interpersonal relationship, driving, shopping, community activity, occupation, and yard work  PERSONAL FACTORS: Past/current experiences are also affecting patient's functional outcome.   REHAB POTENTIAL: Good  CLINICAL DECISION MAKING:  Evolving/moderate complexity  EVALUATION COMPLEXITY: Moderate   GOALS: Goals reviewed with patient? Yes  SHORT TERM GOALS: Target date: 05/05/2022  I with basic HEP Baseline: Goal status: INITIAL   LONG TERM GOALS: Target date: 06/30/2022   I with final HEP Baseline:  Goal status: INITIAL  2.  Increase L knee strength to at least 4/5 throughout Baseline: 2+/3- Goal status: INITIAL  3.  Increase R knee ROM to at least 5-120, with pain < 3/10 Baseline: 14-65 AROM Goal status: INITIAL  4.  Patient will walk at least 400' with LRAD, MI. Baseline:  Goal status: INITIAL  5.  Patient will report to his normal daily routine with L knee pain of < 3/10 Baseline:  Goal status: INITIAL  PLAN:  PT FREQUENCY: 2x/week  PT DURATION: 12 weeks  PLANNED INTERVENTIONS: Therapeutic exercises, Therapeutic activity, Neuromuscular re-education, Balance training, Gait training, Patient/Family education, Self Care, Joint mobilization, Stair training, DME instructions, Dry Needling, Electrical stimulation, Cryotherapy, Moist heat, Vasopneumatic device, Ultrasound, Ionotophoresis 484mml Dexamethasone, and Manual therapy  PLAN FOR NEXT SESSION: Assess and progress   AnFranki MonteTA 04/23/2022, 9:29 AM [id number]: [phone] Fax:  [phone] Details  Name: Charles ESPANARN: [id number] of Birth: 10/01/22/1962eferring Provider:  ReLennie OdorPA  Encounter Date: 04/23/2022   RoScot JunPTA 04/23/2022, 9:29 AM  [id number]: [phone] Fax:  [phone]

## 2022-04-29 ENCOUNTER — Ambulatory Visit: Payer: BC Managed Care – PPO | Admitting: Physical Therapy

## 2022-05-13 DIAGNOSIS — Z471 Aftercare following joint replacement surgery: Secondary | ICD-10-CM | POA: Diagnosis not present

## 2022-05-13 DIAGNOSIS — Z96652 Presence of left artificial knee joint: Secondary | ICD-10-CM | POA: Diagnosis not present

## 2022-08-08 DIAGNOSIS — J302 Other seasonal allergic rhinitis: Secondary | ICD-10-CM | POA: Diagnosis not present

## 2022-08-08 DIAGNOSIS — Z0000 Encounter for general adult medical examination without abnormal findings: Secondary | ICD-10-CM | POA: Diagnosis not present

## 2022-08-08 DIAGNOSIS — J449 Chronic obstructive pulmonary disease, unspecified: Secondary | ICD-10-CM | POA: Diagnosis not present

## 2022-08-08 DIAGNOSIS — I10 Essential (primary) hypertension: Secondary | ICD-10-CM | POA: Diagnosis not present

## 2022-08-08 DIAGNOSIS — Z23 Encounter for immunization: Secondary | ICD-10-CM | POA: Diagnosis not present

## 2022-08-08 DIAGNOSIS — E7800 Pure hypercholesterolemia, unspecified: Secondary | ICD-10-CM | POA: Diagnosis not present

## 2022-08-08 DIAGNOSIS — Z125 Encounter for screening for malignant neoplasm of prostate: Secondary | ICD-10-CM | POA: Diagnosis not present

## 2022-09-22 DIAGNOSIS — Z1211 Encounter for screening for malignant neoplasm of colon: Secondary | ICD-10-CM | POA: Diagnosis not present

## 2022-10-08 DIAGNOSIS — J302 Other seasonal allergic rhinitis: Secondary | ICD-10-CM | POA: Diagnosis not present

## 2022-10-08 DIAGNOSIS — G2581 Restless legs syndrome: Secondary | ICD-10-CM | POA: Diagnosis not present

## 2022-10-08 DIAGNOSIS — F419 Anxiety disorder, unspecified: Secondary | ICD-10-CM | POA: Diagnosis not present

## 2022-10-08 DIAGNOSIS — E7800 Pure hypercholesterolemia, unspecified: Secondary | ICD-10-CM | POA: Diagnosis not present

## 2023-02-02 DIAGNOSIS — T63481A Toxic effect of venom of other arthropod, accidental (unintentional), initial encounter: Secondary | ICD-10-CM | POA: Diagnosis not present

## 2023-03-30 DIAGNOSIS — R195 Other fecal abnormalities: Secondary | ICD-10-CM | POA: Diagnosis not present

## 2023-04-09 DIAGNOSIS — R195 Other fecal abnormalities: Secondary | ICD-10-CM | POA: Diagnosis not present

## 2023-04-09 DIAGNOSIS — K648 Other hemorrhoids: Secondary | ICD-10-CM | POA: Diagnosis not present

## 2023-04-09 DIAGNOSIS — D123 Benign neoplasm of transverse colon: Secondary | ICD-10-CM | POA: Diagnosis not present

## 2023-04-09 DIAGNOSIS — D122 Benign neoplasm of ascending colon: Secondary | ICD-10-CM | POA: Diagnosis not present

## 2023-04-09 DIAGNOSIS — K5730 Diverticulosis of large intestine without perforation or abscess without bleeding: Secondary | ICD-10-CM | POA: Diagnosis not present

## 2023-04-09 DIAGNOSIS — Z800 Family history of malignant neoplasm of digestive organs: Secondary | ICD-10-CM | POA: Diagnosis not present

## 2023-04-09 DIAGNOSIS — D124 Benign neoplasm of descending colon: Secondary | ICD-10-CM | POA: Diagnosis not present

## 2023-04-09 DIAGNOSIS — K621 Rectal polyp: Secondary | ICD-10-CM | POA: Diagnosis not present

## 2023-04-14 IMAGING — DX DG CHEST 2V
2 series · 2 of 2 positions shown · non-contrast
Comparison: September 11, 2016.

CLINICAL DATA: Shortness of breath.  Chest pain.

EXAM:
CHEST - 2 VIEW

[chest pa]
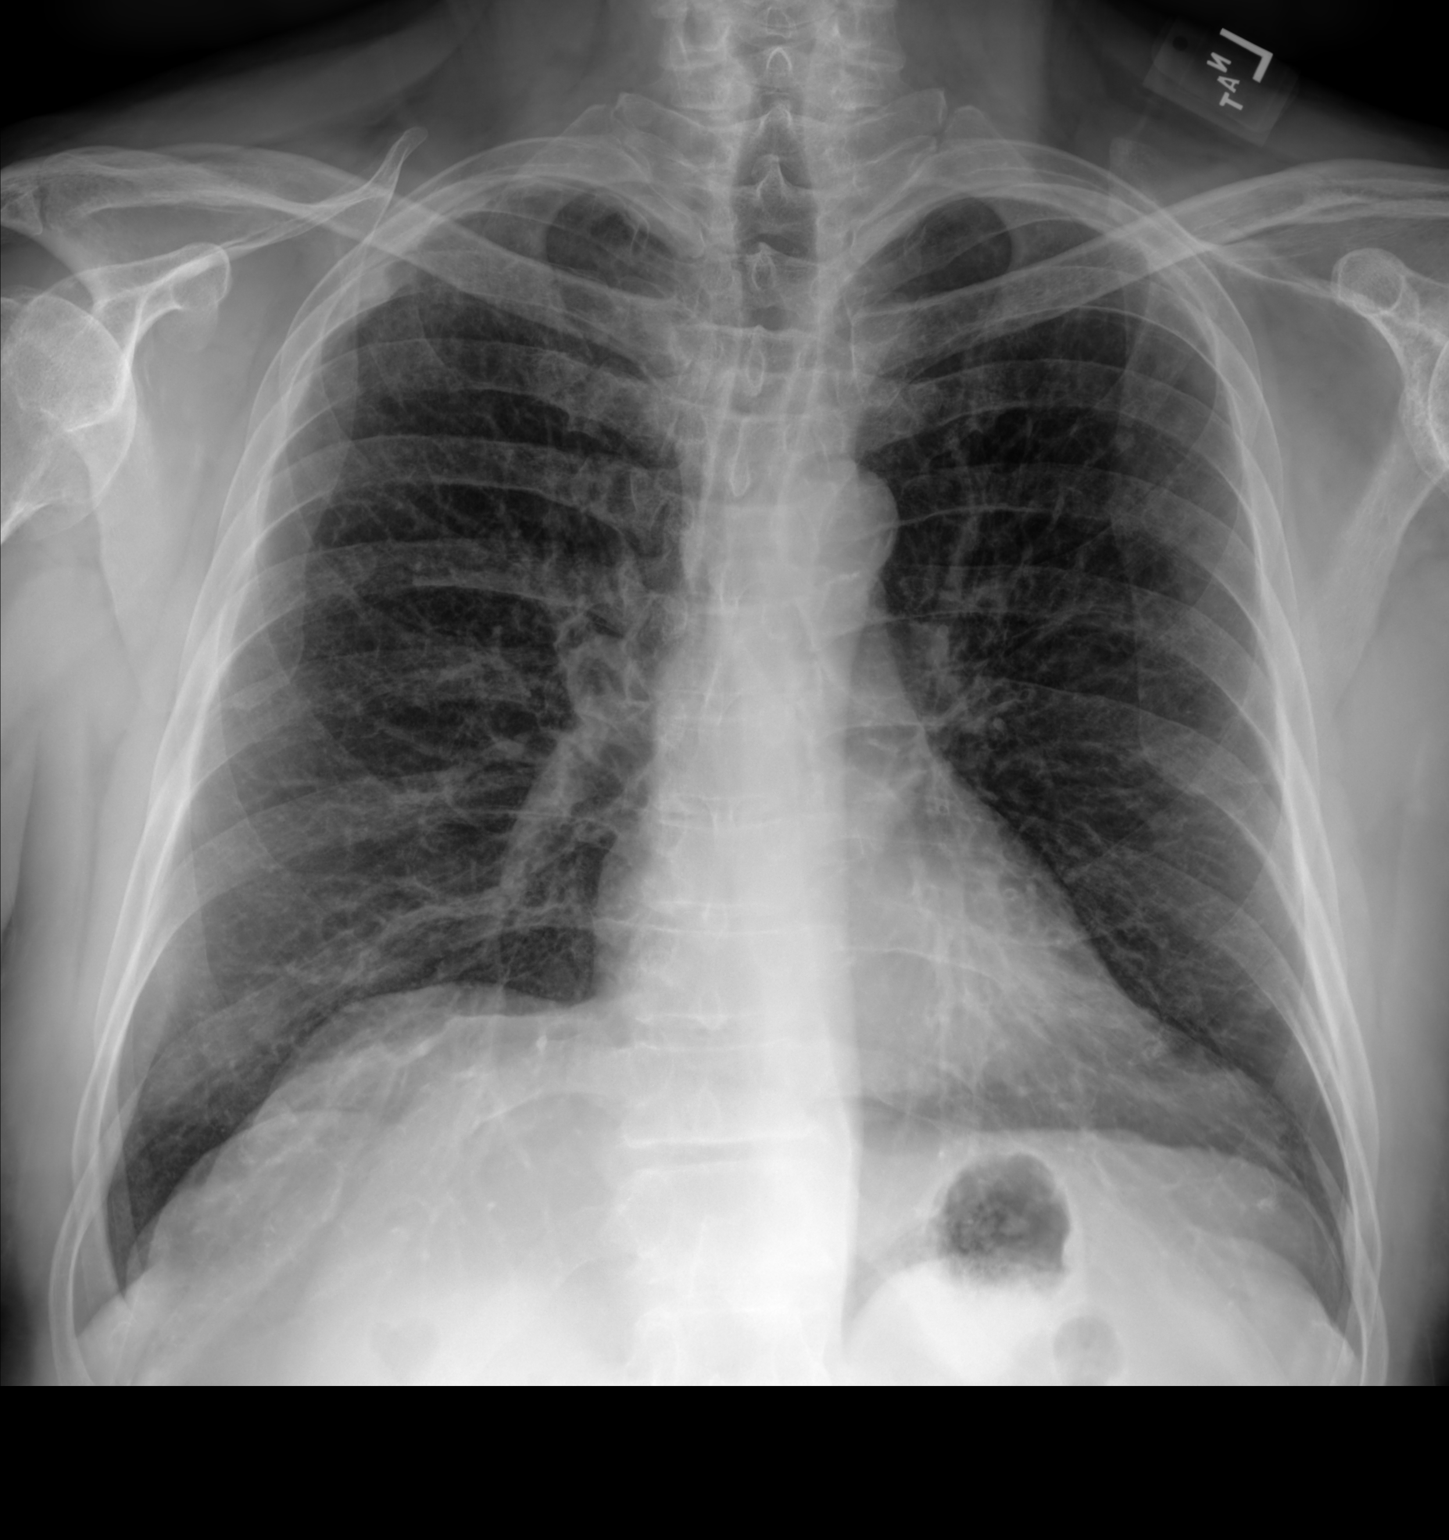

[chest lat]
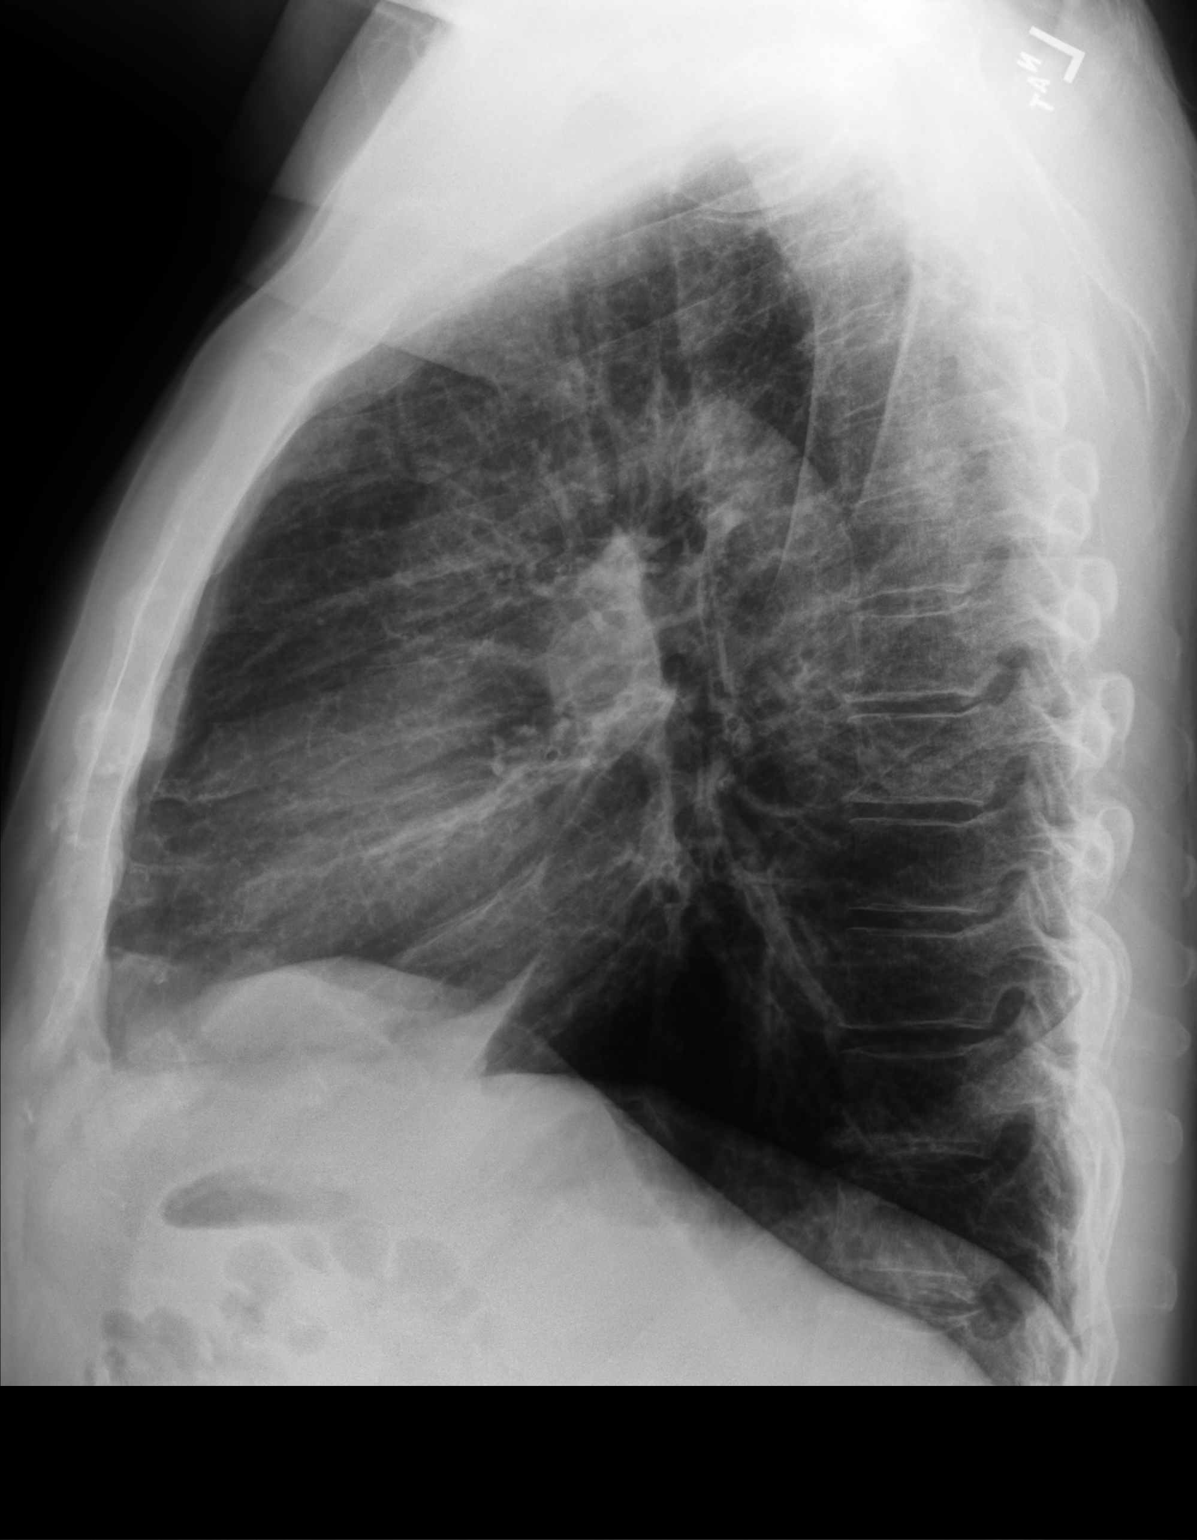

[2 of 2 positions shown; findings below may reference images not displayed]

FINDINGS: The heart size and mediastinal contours are within normal limits.
Mild prominence of the interstitial markings, similar to the prior.
No consolidation. No visible pleural effusions or pneumothorax.
Right-sided skin fold.
IMPRESSION: Mild prominence of the interstitial markings, favored chronic given
similar findings on the prior. No evidence of acute cardiopulmonary
disease.

## 2023-07-02 DIAGNOSIS — R21 Rash and other nonspecific skin eruption: Secondary | ICD-10-CM | POA: Diagnosis not present

## 2023-07-02 DIAGNOSIS — D179 Benign lipomatous neoplasm, unspecified: Secondary | ICD-10-CM | POA: Diagnosis not present

## 2023-07-02 DIAGNOSIS — M25559 Pain in unspecified hip: Secondary | ICD-10-CM | POA: Diagnosis not present

## 2023-07-02 DIAGNOSIS — R1032 Left lower quadrant pain: Secondary | ICD-10-CM | POA: Diagnosis not present

## 2023-08-13 DIAGNOSIS — J302 Other seasonal allergic rhinitis: Secondary | ICD-10-CM | POA: Diagnosis not present

## 2023-08-13 DIAGNOSIS — J449 Chronic obstructive pulmonary disease, unspecified: Secondary | ICD-10-CM | POA: Diagnosis not present

## 2023-08-13 DIAGNOSIS — Z125 Encounter for screening for malignant neoplasm of prostate: Secondary | ICD-10-CM | POA: Diagnosis not present

## 2023-08-13 DIAGNOSIS — E7800 Pure hypercholesterolemia, unspecified: Secondary | ICD-10-CM | POA: Diagnosis not present

## 2023-08-13 DIAGNOSIS — I10 Essential (primary) hypertension: Secondary | ICD-10-CM | POA: Diagnosis not present
